# Patient Record
Sex: Male | Born: 1950
Health system: Southern US, Community
[De-identification: ages and names within clinical notes are randomized; demographics above are authoritative.]

## PROBLEM LIST (undated history)

## (undated) DIAGNOSIS — J302 Other seasonal allergic rhinitis: Secondary | ICD-10-CM

## (undated) DIAGNOSIS — E669 Obesity, unspecified: Secondary | ICD-10-CM

## (undated) DIAGNOSIS — I82409 Acute embolism and thrombosis of unspecified deep veins of unspecified lower extremity: Secondary | ICD-10-CM

## (undated) DIAGNOSIS — J189 Pneumonia, unspecified organism: Secondary | ICD-10-CM

## (undated) HISTORY — DX: Acute embolism and thrombosis of unspecified deep veins of unspecified lower extremity: I82.409

---
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
## 1972-05-19 HISTORY — PX: ANKLE SURGERY: SHX546

## 2010-11-29 ENCOUNTER — Ambulatory Visit: Payer: Self-pay | Admitting: Gastroenterology

## 2010-12-02 LAB — PATHOLOGY REPORT

## 2010-12-15 ENCOUNTER — Emergency Department: Payer: Self-pay | Admitting: Emergency Medicine

## 2017-05-19 DIAGNOSIS — I82409 Acute embolism and thrombosis of unspecified deep veins of unspecified lower extremity: Secondary | ICD-10-CM

## 2017-05-19 HISTORY — DX: Acute embolism and thrombosis of unspecified deep veins of unspecified lower extremity: I82.409

## 2018-05-03 ENCOUNTER — Inpatient Hospital Stay
Admission: EM | Admit: 2018-05-03 | Discharge: 2018-05-06 | DRG: 272 | Disposition: A | Discharge status: Home / Self Care | Payer: Medicare HMO | Attending: Specialist | Admitting: Specialist

## 2018-05-03 ENCOUNTER — Encounter: Payer: Self-pay | Admitting: Intensive Care

## 2018-05-03 ENCOUNTER — Other Ambulatory Visit: Payer: Self-pay

## 2018-05-03 ENCOUNTER — Emergency Department: Payer: Medicare HMO

## 2018-05-03 DIAGNOSIS — Z8249 Family history of ischemic heart disease and other diseases of the circulatory system: Secondary | ICD-10-CM | POA: Diagnosis not present

## 2018-05-03 DIAGNOSIS — J309 Allergic rhinitis, unspecified: Secondary | ICD-10-CM | POA: Diagnosis present

## 2018-05-03 DIAGNOSIS — Z7951 Long term (current) use of inhaled steroids: Secondary | ICD-10-CM

## 2018-05-03 DIAGNOSIS — I82412 Acute embolism and thrombosis of left femoral vein: Secondary | ICD-10-CM | POA: Diagnosis not present

## 2018-05-03 DIAGNOSIS — I824Y2 Acute embolism and thrombosis of unspecified deep veins of left proximal lower extremity: Secondary | ICD-10-CM

## 2018-05-03 DIAGNOSIS — Z6838 Body mass index (BMI) 38.0-38.9, adult: Secondary | ICD-10-CM

## 2018-05-03 DIAGNOSIS — E669 Obesity, unspecified: Secondary | ICD-10-CM | POA: Diagnosis present

## 2018-05-03 DIAGNOSIS — I82432 Acute embolism and thrombosis of left popliteal vein: Secondary | ICD-10-CM | POA: Diagnosis present

## 2018-05-03 DIAGNOSIS — I82422 Acute embolism and thrombosis of left iliac vein: Secondary | ICD-10-CM | POA: Diagnosis present

## 2018-05-03 DIAGNOSIS — Z79899 Other long term (current) drug therapy: Secondary | ICD-10-CM | POA: Diagnosis not present

## 2018-05-03 DIAGNOSIS — I82409 Acute embolism and thrombosis of unspecified deep veins of unspecified lower extremity: Secondary | ICD-10-CM | POA: Diagnosis present

## 2018-05-03 HISTORY — DX: Pneumonia, unspecified organism: J18.9

## 2018-05-03 HISTORY — DX: Obesity, unspecified: E66.9

## 2018-05-03 LAB — CBC
HEMATOCRIT: 42.3 % (ref 39.0–52.0)
Hemoglobin: 14.1 g/dL (ref 13.0–17.0)
MCH: 31.5 pg (ref 26.0–34.0)
MCHC: 33.3 g/dL (ref 30.0–36.0)
MCV: 94.6 fL (ref 80.0–100.0)
Platelets: 200 10*3/uL (ref 150–400)
RBC: 4.47 MIL/uL (ref 4.22–5.81)
RDW: 14.1 % (ref 11.5–15.5)
WBC: 9.9 10*3/uL (ref 4.0–10.5)
nRBC: 0 % (ref 0.0–0.2)

## 2018-05-03 LAB — COMPREHENSIVE METABOLIC PANEL
ALT: 102 U/L — ABNORMAL HIGH (ref 0–44)
AST: 47 U/L — ABNORMAL HIGH (ref 15–41)
Albumin: 3.2 g/dL — ABNORMAL LOW (ref 3.5–5.0)
Alkaline Phosphatase: 81 U/L (ref 38–126)
Anion gap: 8 (ref 5–15)
BILIRUBIN TOTAL: 1 mg/dL (ref 0.3–1.2)
BUN: 10 mg/dL (ref 8–23)
CO2: 24 mmol/L (ref 22–32)
Calcium: 8.7 mg/dL — ABNORMAL LOW (ref 8.9–10.3)
Chloride: 101 mmol/L (ref 98–111)
Creatinine, Ser: 0.74 mg/dL (ref 0.61–1.24)
GFR calc non Af Amer: >60 mL/min (ref >60)
Glucose, Bld: 95 mg/dL (ref 70–99)
Potassium: 3.8 mmol/L (ref 3.5–5.1)
SODIUM: 133 mmol/L — AB (ref 135–145)
Total Protein: 6.9 g/dL (ref 6.5–8.1)

## 2018-05-03 LAB — PROTIME-INR
INR: 1.02
Prothrombin Time: 13.3 seconds (ref 11.4–15.2)

## 2018-05-03 LAB — APTT: aPTT: 31 seconds (ref 24–36)

## 2018-05-03 MED ORDER — VITAMIN C 500 MG PO TABS
250.0000 mg | ORAL_TABLET | Freq: Every day | ORAL | Status: DC
  Administered 2018-05-04 – 2018-05-06 (×3): 250 mg via ORAL
  Filled 2018-05-03 (×3): qty 1

## 2018-05-03 MED ORDER — ACETAMINOPHEN 650 MG RE SUPP: 650.0000 mg | Freq: Four times a day (QID) | RECTAL | Status: DC | PRN

## 2018-05-03 MED ORDER — ONDANSETRON HCL 4 MG PO TABS: 4.0000 mg | ORAL_TABLET | Freq: Four times a day (QID) | ORAL | Status: DC | PRN

## 2018-05-03 MED ORDER — HEPARIN BOLUS VIA INFUSION
7000.0000 [IU] | Freq: Once | INTRAVENOUS | Status: AC
  Administered 2018-05-03: 7000 [IU] via INTRAVENOUS
  Filled 2018-05-03: qty 7000

## 2018-05-03 MED ORDER — HEPARIN (PORCINE) 25000 UT/250ML-% IV SOLN
1900.0000 [IU]/h | INTRAVENOUS | Status: DC
  Administered 2018-05-03 – 2018-05-04 (×2): 1900 [IU]/h via INTRAVENOUS
  Filled 2018-05-03 (×3): qty 250

## 2018-05-03 MED ORDER — ONDANSETRON HCL 4 MG/2ML IJ SOLN: 4.0000 mg | Freq: Four times a day (QID) | INTRAMUSCULAR | Status: DC | PRN

## 2018-05-03 MED ORDER — ADULT MULTIVITAMIN LIQUID CH
Freq: Every day | ORAL | Status: DC
  Administered 2018-05-04 – 2018-05-05 (×2): via ORAL
  Filled 2018-05-03 (×3): qty 15

## 2018-05-03 MED ORDER — LORATADINE 10 MG PO TABS
10.0000 mg | ORAL_TABLET | Freq: Every day | ORAL | Status: DC
  Administered 2018-05-04 – 2018-05-06 (×3): 10 mg via ORAL
  Filled 2018-05-03 (×3): qty 1

## 2018-05-03 MED ORDER — FLUTICASONE PROPIONATE 50 MCG/ACT NA SUSP
2.0000 | Freq: Every day | NASAL | Status: DC | PRN
  Filled 2018-05-03: qty 16

## 2018-05-03 MED ORDER — ACETAMINOPHEN 325 MG PO TABS: 650.0000 mg | ORAL_TABLET | Freq: Four times a day (QID) | ORAL | Status: DC | PRN

## 2018-05-03 NOTE — ED Notes (Signed)
Introduced to patient, initial assessment completed, pt updated on POC. TV remote provided and call bell within reach, Family at bedside. Awaits [place] results and provider evaluation at this time.

## 2018-05-03 NOTE — ED Notes (Signed)
Pt transported to 1A at this time via stretcher with RN and cardiac monitoring.

## 2018-05-03 NOTE — ED Notes (Signed)
Pt back in room from US.

## 2018-05-03 NOTE — ED Provider Notes (Signed)
[facility] Emergency Department Provider Note  Time seen: 7:56 PM  I have reviewed the triage vital signs and the nursing notes.   HISTORY  Chief Complaint Leg Swelling (left)    HPI John Jenkins is a 67 y.o. male with no significant past medical history presents to the emergency department for left leg swelling and tightness.  According to the patient he was diagnosed with pneumonia in November, just finished his course of steroids and antibiotics approximately 1 week ago.  States starting 2 weeks ago he notes a very minimal amount of swelling in the left leg, however the past 2 to 3 days the swelling has significantly increased along with a tightness sensation to his leg.  Denies any chest pain or any trouble breathing.  States his breathing has significantly improved since finishing the antibiotics.  No history of blood clots.  Does not take any blood thinners.   Past Medical History:  Diagnosis Date  . Obese     There are no active problems to display for this patient.   History reviewed. No pertinent surgical history.  Prior to Admission medications   Not on File    No Known Allergies  History reviewed. No pertinent family history.  Social History Social History   Tobacco Use  . Smoking status: Never Smoker  . Smokeless tobacco: Never Used  Substance Use Topics  . Alcohol use: Never    Frequency: Never  . Drug use: Never    Review of Systems Constitutional: Negative for fever. Cardiovascular: Negative for chest pain. Respiratory: Negative for shortness of breath. Gastrointestinal: Negative for abdominal pain Genitourinary: Negative for urinary compaints Musculoskeletal: Positive for left leg pain and swelling. Skin: Negative for skin complaints  Neurological: Negative for headache All other ROS negative  ____________________________________________   PHYSICAL EXAM:  VITAL SIGNS: ED Triage Vitals [05/03/18 1845]  Enc Vitals  Group     BP (!) 127/96     Pulse Rate 96     Resp 16     Temp 99.3 F (37.4 C)     Temp Source Oral     SpO2 99 %     Weight (!) 324 lb (147 kg)     Height 6\' 4"  (1.93 m)     Head Circumference      Peak Flow      Pain Score 0     Pain Loc      Pain Edu?      Excl. in [place]?    Constitutional: Alert and oriented. Well appearing and in no distress. Eyes: Normal exam ENT   Head: Normocephalic and atraumatic.   Mouth/Throat: Mucous membranes are moist. Cardiovascular: Normal rate, regular rhythm. No murmur Respiratory: Normal respiratory effort without tachypnea nor retractions. Breath sounds are clear  Gastrointestinal: Soft and nontender. No distention. Musculoskeletal: 2-3+ peripheral edema in the left lower extremity only.  2+ DP pulse.  Warm leg. Neurologic:  Normal speech and language. No gross focal neurologic deficits  Skin:  Skin is warm, dry and intact.  Psychiatric: Mood and affect are normal.  ____________________________________________   RADIOLOGY  Ultrasound shows significant DVT in the left leg  ____________________________________________   INITIAL IMPRESSION / ASSESSMENT AND PLAN / ED COURSE  Pertinent labs & imaging results that were available during my care of the patient were reviewed by me and considered in my medical decision making (see chart for details).  Patient presents to the emergency department for left leg tightness and swelling  for 2 weeks much worse over the past 2 days.  Differential would include peripheral edema versus DVT.  Patient did recently have pneumonia, states he has not been nearly as active which would increase his risk for DVT.  We will obtain ultrasound to further evaluate.  Patient agreeable to plan of care.  Ultrasound shows significant amount of DVT throughout the entire left leg.  Given the large amount of clot burden we will discuss with vascular surgery for further recommendations.  I discussed the patient with  Dr. Delana Meyer of vascular surgery, will admit to the hospitalist service on heparin with planned thrombolysis tomorrow.  Patient agreeable to plan of care.  ____________________________________________   FINAL CLINICAL IMPRESSION(S) / ED DIAGNOSES  Left lower extremity DVT    Harvest Dark, MD 05/03/18 2014

## 2018-05-03 NOTE — H&P (Signed)
[facility] [place] NAME: John Jenkins    MR#:  [id number]  DATE OF BIRTH:  1951/05/14  DATE OF ADMISSION:  05/03/2018  PRIMARY CARE PHYSICIAN: Dion Body, MD   REQUESTING/REFERRING PHYSICIAN: Dr. Harvest Dark  CHIEF COMPLAINT:   Chief Complaint  Patient presents with  . Leg Swelling    left    HISTORY OF PRESENT ILLNESS:  John Jenkins  is a 67 y.o. male with a known history of obesity, recent bronchitis/pneumonia who presents to the hospital due to left lower extremity swelling and some pain.  Patient said he had a upper respiratory infection/pneumonia that he has been battling last month and has not been as active and since this past Saturday he noticed he had some left leg swelling and tightness.  It had progressively gotten worse and therefore he came to the ER for further evaluation.  Patient underwent a ultrasound of his left lower extremity showed a extensive left lower extremity DVT.  Hospitalist services were contacted for admission.  Patient denies any chest pains, shortness of breath, palpitations, hemoptysis, abdominal pain, nausea or vomiting or any other associated symptoms presently.  Patient has never had a previous DVT in the past.  PAST MEDICAL HISTORY:   Past Medical History:  Diagnosis Date  . Obese     PAST SURGICAL HISTORY:  History reviewed. No pertinent surgical history.  SOCIAL HISTORY:   Social History   Tobacco Use  . Smoking status: Never Smoker  . Smokeless tobacco: Never Used  Substance Use Topics  . Alcohol use: Never    Frequency: Never    FAMILY HISTORY:   Family History  Problem Relation Age of Onset  . Heart disease Mother   . Heart attack Father     DRUG ALLERGIES:  No Known Allergies  REVIEW OF SYSTEMS:   Review of Systems  Constitutional: Negative for fever and weight loss.  HENT: Negative for congestion, nosebleeds and tinnitus.   Eyes: Negative for blurred vision,  double vision and redness.  Respiratory: Negative for cough, hemoptysis and shortness of breath.   Cardiovascular: Positive for leg swelling (left leg). Negative for chest pain, orthopnea and PND.  Gastrointestinal: Negative for abdominal pain, diarrhea, melena, nausea and vomiting.  Genitourinary: Negative for dysuria, hematuria and urgency.  Musculoskeletal: Negative for falls and joint pain.  Neurological: Negative for dizziness, tingling, sensory change, focal weakness, seizures, weakness and headaches.  Endo/Heme/Allergies: Negative for polydipsia. Does not bruise/bleed easily.  Psychiatric/Behavioral: Negative for depression and memory loss. The patient is not nervous/anxious.     MEDICATIONS AT HOME:   Prior to Admission medications   Medication Sig Start Date End Date Taking? Authorizing Provider  Ascorbic Acid (VITAMIN C) 100 MG tablet Take 100 mg by mouth daily.   Yes [provider]  fluticasone (FLONASE) 50 MCG/ACT nasal spray Place 2 sprays into the nose daily.   Yes [provider]  Loratadine 10 MG CAPS Take 10 mg by mouth daily.   Yes [provider]  Multiple Vitamins-Minerals (MULTIVITAMIN PO) Take 1 tablet by mouth daily.   Yes [provider]      VITAL SIGNS:  Blood pressure (!) 127/96, pulse 96, temperature 99.3 F (37.4 C), temperature source Oral, resp. rate 16, height 6\' 4"  (1.93 m), weight (!) 147 kg, SpO2 99 %.  PHYSICAL EXAMINATION:  Physical Exam  GENERAL:  67 y.o.-year-old obese patient lying in bed in no acute distress.  EYES: Pupils  equal, round, reactive to light and accommodation. No scleral icterus. Extraocular muscles intact.  HEENT: Head atraumatic, normocephalic. Oropharynx and nasopharynx clear. No oropharyngeal erythema, moist oral mucosa  NECK:  Supple, no jugular venous distention. No thyroid enlargement, no tenderness.  LUNGS: Good A/E b/l, no wheezing, rales, rhonchi. No use of accessory muscles of  respiration.  CARDIOVASCULAR: S1, S2 RRR. No murmurs, rubs, gallops, clicks.  ABDOMEN: Soft, nontender, nondistended. Bowel sounds present. No organomegaly or mass.  EXTREMITIES: + LLE edema > right, No cyanosis, or clubbing. + 2 pedal & radial pulses b/l.   NEUROLOGIC: Cranial nerves II through XII are intact. No focal Motor or sensory deficits appreciated b/l.  PSYCHIATRIC: The patient is alert and oriented x 3.  SKIN: No obvious rash, lesion, or ulcer.   LABORATORY PANEL:   CBC Recent Labs  Lab 05/03/18 2005  WBC 9.9  HGB 14.1  HCT 42.3  PLT 200   ------------------------------------------------------------------------------------------------------------------  Chemistries  Recent Labs  Lab 05/03/18 2005  NA 133*  K 3.8  CL 101  CO2 24  GLUCOSE 95  BUN 10  CREATININE 0.74  CALCIUM 8.7*  AST 47*  ALT 102*  ALKPHOS 81  BILITOT 1.0   ------------------------------------------------------------------------------------------------------------------  Cardiac Enzymes No results for input(s): TROPONINI in the last 168 hours. ------------------------------------------------------------------------------------------------------------------  RADIOLOGY:  US Venous Img Lower Unilateral Left  Result Date: 05/03/2018 CLINICAL DATA:  Left calf pain and swelling for several days EXAM: LEFT LOWER EXTREMITY VENOUS DOPPLER ULTRASOUND TECHNIQUE: Gray-scale sonography with graded compression, as well as color Doppler and duplex ultrasound were performed to evaluate the lower extremity deep venous systems from the level of the common femoral vein and including the common femoral, femoral, profunda femoral, popliteal and calf veins including the posterior tibial, peroneal and gastrocnemius veins when visible. The superficial great saphenous vein was also interrogated. Spectral Doppler was utilized to evaluate flow at rest and with distal augmentation maneuvers in the common femoral,  femoral and popliteal veins. COMPARISON:  None. FINDINGS: Contralateral Common Femoral Vein: Respiratory phasicity is normal and symmetric with the symptomatic side. No evidence of thrombus. Normal compressibility. Common Femoral Vein: Extensive thrombus is noted with decreased compressibility and decreased flow. Saphenofemoral Junction: Thrombus is noted within the saphenous vein at the saphenofemoral junction. Decreased compressibility in flow is noted. Profunda Femoral Vein: Thrombus is noted with decreased compressibility. Femoral Vein: Thrombus is noted with decreased compressibility and flow. Popliteal Vein: Thrombus is noted with decreased compressibility and flow. Calf Veins: Thrombus is noted with decreased compressibility and flow. Superficial Great Saphenous Vein: Thrombus is noted. Venous Reflux:  None. Other Findings:  None. IMPRESSION: Diffuse deep venous thrombosis within the left leg. Electronically Signed   By: Inez Catalina M.D.   On: 05/03/2018 19:55     IMPRESSION AND PLAN:   67 year old obese male with no significant past medical history who presents to the hospital due to left lower extremity swelling and redness.  1.  Left lower extremity DVT-this is the cause of patient's worsening left lower extremity redness and swelling. -This is likely a provoked DVT given patient's poor activity since his recent bout of pneumonia and bronchitis. -We will treat the patient with IV heparin drip for now.  ER physician discussed with vascular surgery who plans on doing thrombolysis tomorrow.  We will make him n.p.o. after midnight.  Patient will still need long-term anticoagulation post thrombolysis.   2. Hx of allergic Rhinitis - no acute symptoms.  - cont. Flonase, Claritin.  All the records are reviewed and case discussed with ED provider. Management plans discussed with the patient, family and they are in agreement.  CODE STATUS: Full code  TOTAL TIME TAKING CARE OF THIS PATIENT:  40 minutes.    Henreitta Leber M.D on 05/03/2018 at 9:18 PM  Between 7am to 6pm - Pager - [phone]  After 6pm go to [email or web address] - password [email or web address] [place] [facility]  Office  [phone]  CC: Primary care physician; Dion Body, MD

## 2018-05-03 NOTE — ED Notes (Signed)
Provider at bedside at this time

## 2018-05-03 NOTE — ED Notes (Signed)
PT provided with water.

## 2018-05-03 NOTE — ED Triage Notes (Signed)
Patient reports left leg swelling started Saturday morning. Tightness present to Left calf. Patient reports he has been less mobile due to being diagnosed with pneumonia March 29, 2018. [facility] sent patient here for possible blood clot in leg. Denies chest pain

## 2018-05-03 NOTE — Consult Note (Signed)
ANTICOAGULATION CONSULT NOTE - Follow Up Consult  Pharmacy Consult for Heparin infusion Indication: DVT  No Known Allergies  Patient Measurements: Height: 6\' 4"  (193 cm) Weight: (!) 324 lb (147 kg) IBW/kg (Calculated) : 86.8 Heparin Dosing Weight: 120  Vital Signs: Temp: 99.3 F (37.4 C) (12/16 1845) Temp Source: Oral (12/16 1845) BP: 127/96 (12/16 1845) Pulse Rate: 96 (12/16 1845)  Labs: No results for input(s): HGB, HCT, PLT, APTT, LABPROT, INR, HEPARINUNFRC, HEPRLOWMOCWT, CREATININE, CKTOTAL, CKMB, TROPONINI in the last 72 hours.  CrCl cannot be calculated (No successful lab value found.).   Medications:  (Not in a hospital admission)   Assessment: 67 y.o. male with no significant past medical history presents to the emergency department for left leg swelling and tightness.  Per chart review no history of anticoagulation therapy.  Goal of Therapy:  Heparin level 0.3-0.7 units/ml Monitor platelets by anticoagulation protocol: Yes   Plan:  Give 7000 units bolus x 1 Start heparin infusion at 1900 units/hr Check anti-Xa level in 6 hours until two consecutive therapeutic levels then daily and CBC daily while on heparin Continue to monitor H&H and platelets  Forrest Moron, PharmD Clinical Pharmacist 05/03/2018,8:18 PM

## 2018-05-04 ENCOUNTER — Other Ambulatory Visit (INDEPENDENT_AMBULATORY_CARE_PROVIDER_SITE_OTHER): Payer: Self-pay | Admitting: Vascular Surgery

## 2018-05-04 ENCOUNTER — Encounter: Payer: Self-pay | Admitting: *Deleted

## 2018-05-04 ENCOUNTER — Encounter
Admission: EM | Disposition: A | Discharge status: Home / Self Care | Payer: Self-pay | Source: Home / Self Care | Attending: Internal Medicine

## 2018-05-04 DIAGNOSIS — I82432 Acute embolism and thrombosis of left popliteal vein: Secondary | ICD-10-CM

## 2018-05-04 DIAGNOSIS — I82422 Acute embolism and thrombosis of left iliac vein: Secondary | ICD-10-CM

## 2018-05-04 DIAGNOSIS — I82412 Acute embolism and thrombosis of left femoral vein: Secondary | ICD-10-CM

## 2018-05-04 HISTORY — PX: PERIPHERAL VASCULAR THROMBECTOMY: CATH118306

## 2018-05-04 LAB — CBC
HCT: 40.9 % (ref 39.0–52.0)
Hemoglobin: 13.5 g/dL (ref 13.0–17.0)
MCH: 31.5 pg (ref 26.0–34.0)
MCHC: 33 g/dL (ref 30.0–36.0)
MCV: 95.6 fL (ref 80.0–100.0)
Platelets: 193 10*3/uL (ref 150–400)
RBC: 4.28 MIL/uL (ref 4.22–5.81)
RDW: 14.2 % (ref 11.5–15.5)
WBC: 7.8 10*3/uL (ref 4.0–10.5)
nRBC: 0 % (ref 0.0–0.2)

## 2018-05-04 LAB — HEPARIN LEVEL (UNFRACTIONATED)
Heparin Unfractionated: 0.4 IU/mL (ref 0.30–0.70)
Heparin Unfractionated: <0.1 IU/mL — ABNORMAL LOW (ref 0.30–0.70)

## 2018-05-04 SURGERY — PERIPHERAL VASCULAR THROMBECTOMY
Anesthesia: Moderate Sedation | Laterality: Left

## 2018-05-04 MED ORDER — MIDAZOLAM HCL 2 MG/2ML IJ SOLN
INTRAMUSCULAR | Status: DC | PRN
  Administered 2018-05-04: 1 mg via INTRAVENOUS
  Administered 2018-05-04: 2 mg via INTRAVENOUS
  Administered 2018-05-04 (×4): 1 mg via INTRAVENOUS

## 2018-05-04 MED ORDER — IOPAMIDOL (ISOVUE-300) INJECTION 61%
INTRAVENOUS | Status: DC | PRN
  Administered 2018-05-04: 85 mL via INTRAVENOUS

## 2018-05-04 MED ORDER — ALTEPLASE 2 MG IJ SOLR
INTRAMUSCULAR | Status: DC | PRN
  Administered 2018-05-04: 14 mg

## 2018-05-04 MED ORDER — HYDRALAZINE HCL 20 MG/ML IJ SOLN: 5.0000 mg | INTRAMUSCULAR | Status: DC | PRN

## 2018-05-04 MED ORDER — MORPHINE SULFATE (PF) 4 MG/ML IV SOLN: 2.0000 mg | INTRAVENOUS | Status: DC | PRN

## 2018-05-04 MED ORDER — HEPARIN SODIUM (PORCINE) 1000 UNIT/ML IJ SOLN
INTRAMUSCULAR | Status: AC
  Filled 2018-05-04: qty 1

## 2018-05-04 MED ORDER — FENTANYL CITRATE (PF) 100 MCG/2ML IJ SOLN
INTRAMUSCULAR | Status: AC
  Filled 2018-05-04: qty 2

## 2018-05-04 MED ORDER — HEPARIN SODIUM (PORCINE) 1000 UNIT/ML IJ SOLN
INTRAMUSCULAR | Status: DC | PRN
  Administered 2018-05-04 (×2): 3000 [IU] via INTRAVENOUS

## 2018-05-04 MED ORDER — ACETAMINOPHEN 325 MG PO TABS
ORAL_TABLET | ORAL | Status: AC
  Filled 2018-05-04: qty 2

## 2018-05-04 MED ORDER — ONDANSETRON HCL 4 MG/2ML IJ SOLN: 4.0000 mg | Freq: Four times a day (QID) | INTRAMUSCULAR | Status: DC | PRN

## 2018-05-04 MED ORDER — OXYCODONE HCL 5 MG PO TABS
5.0000 mg | ORAL_TABLET | ORAL | Status: DC | PRN
  Administered 2018-05-04 (×2): 5 mg via ORAL
  Filled 2018-05-04 (×2): qty 1

## 2018-05-04 MED ORDER — MIDAZOLAM HCL 5 MG/5ML IJ SOLN
INTRAMUSCULAR | Status: AC
  Filled 2018-05-04: qty 5

## 2018-05-04 MED ORDER — FENTANYL CITRATE (PF) 100 MCG/2ML IJ SOLN
INTRAMUSCULAR | Status: DC | PRN
  Administered 2018-05-04 (×6): 50 ug via INTRAVENOUS

## 2018-05-04 MED ORDER — ACETAMINOPHEN 325 MG PO TABS
650.0000 mg | ORAL_TABLET | ORAL | Status: DC | PRN
  Administered 2018-05-04: 650 mg via ORAL

## 2018-05-04 MED ORDER — CEFAZOLIN SODIUM-DEXTROSE 2-4 GM/100ML-% IV SOLN
INTRAVENOUS | Status: AC
  Administered 2018-05-04: 2 g via INTRAVENOUS
  Filled 2018-05-04: qty 100

## 2018-05-04 MED ORDER — CEFAZOLIN SODIUM-DEXTROSE 2-4 GM/100ML-% IV SOLN
2.0000 g | Freq: Once | INTRAVENOUS | Status: AC
  Administered 2018-05-04: 2 g via INTRAVENOUS
  Filled 2018-05-04: qty 100

## 2018-05-04 MED ORDER — LABETALOL HCL 5 MG/ML IV SOLN: 10.0000 mg | INTRAVENOUS | Status: DC | PRN

## 2018-05-04 MED ORDER — SODIUM CHLORIDE 0.9% FLUSH: 3.0000 mL | INTRAVENOUS | Status: DC | PRN

## 2018-05-04 MED ORDER — HEPARIN (PORCINE) 25000 UT/250ML-% IV SOLN
2100.0000 [IU]/h | INTRAVENOUS | Status: DC
  Administered 2018-05-05: 1900 [IU]/h via INTRAVENOUS
  Filled 2018-05-04: qty 250

## 2018-05-04 MED ORDER — SODIUM CHLORIDE 0.9 % IV SOLN
250.0000 mL | INTRAVENOUS | Status: DC | PRN
  Administered 2018-05-04 – 2018-05-05 (×2): 250 mL via INTRAVENOUS

## 2018-05-04 MED ORDER — METHYLPREDNISOLONE SODIUM SUCC 125 MG IJ SOLR: 125.0000 mg | INTRAMUSCULAR | Status: DC | PRN

## 2018-05-04 MED ORDER — SODIUM CHLORIDE 0.9% FLUSH
3.0000 mL | Freq: Two times a day (BID) | INTRAVENOUS | Status: DC
  Administered 2018-05-05: 3 mL via INTRAVENOUS

## 2018-05-04 MED ORDER — SODIUM CHLORIDE 0.9 % IV SOLN: INTRAVENOUS | Status: AC

## 2018-05-04 MED ORDER — HYDROMORPHONE HCL 1 MG/ML IJ SOLN: 1.0000 mg | Freq: Once | INTRAMUSCULAR | Status: DC | PRN

## 2018-05-04 MED ORDER — FAMOTIDINE 20 MG PO TABS: 40.0000 mg | ORAL_TABLET | ORAL | Status: DC | PRN

## 2018-05-04 MED ORDER — SODIUM CHLORIDE 0.9 % IV SOLN
INTRAVENOUS | Status: DC
  Administered 2018-05-04: 09:00:00 via INTRAVENOUS

## 2018-05-04 MED ORDER — ALTEPLASE 2 MG IJ SOLR
INTRAMUSCULAR | Status: AC
  Filled 2018-05-04: qty 14

## 2018-05-04 MED ORDER — LIDOCAINE HCL (PF) 1 % IJ SOLN
INTRAMUSCULAR | Status: AC
  Filled 2018-05-04: qty 30

## 2018-05-04 SURGICAL SUPPLY — 30 items
BALLN ATG 12X6X80 (BALLOONS) ×3
BALLN ATG 14X4X80 (BALLOONS) ×3
BALLN ATG 18X6X80 (BALLOONS) ×3
BALLN DORADO 8X100X80 (BALLOONS) ×3
BALLOON ATG 12X6X80 (BALLOONS) ×1 IMPLANT
BALLOON ATG 14X4X80 (BALLOONS) ×1 IMPLANT
BALLOON ATG 18X6X80 (BALLOONS) ×1 IMPLANT
BALLOON DORADO 8X100X80 (BALLOONS) ×1 IMPLANT
CANISTER PENUMBRA ENGINE (MISCELLANEOUS) ×3 IMPLANT
CATH BEACON 5 .035 65 KMP TIP (CATHETERS) ×3 IMPLANT
CATH BEACON 5 .038 100 VERT TP (CATHETERS) ×3 IMPLANT
CATH INDIGO 8XTORQ 115 KIT (CATHETERS) ×3 IMPLANT
CATH INDIGO SEP 8 (CATHETERS) ×3 IMPLANT
CATH INFUS 135CMX50CM (CATHETERS) ×3 IMPLANT
CATH VISIONS PV .035 IVUS (CATHETERS) ×3 IMPLANT
DEVICE PRESTO INFLATION (MISCELLANEOUS) ×3 IMPLANT
DEVICE TORQUE .025-.038 (MISCELLANEOUS) ×3 IMPLANT
NEEDLE ENTRY 21GA 7CM ECHOTIP (NEEDLE) ×3 IMPLANT
PACK ANGIOGRAPHY (CUSTOM PROCEDURE TRAY) ×3 IMPLANT
SET INTRO CAPELLA COAXIAL (SET/KITS/TRAYS/PACK) ×3 IMPLANT
SHEATH BRITE TIP 10FRX11 (SHEATH) ×3 IMPLANT
SHEATH BRITE TIP 8FRX11 (SHEATH) ×3 IMPLANT
SHIELD X-DRAPE GOLD 12X17 (MISCELLANEOUS) ×3 IMPLANT
STENT VENOVO 20X80X80 (Permanent Stent) ×3 IMPLANT
SYR MEDRAD MARK V 150ML (SYRINGE) ×3 IMPLANT
TUBING CONTRAST HIGH PRESS 72 (TUBING) ×3 IMPLANT
WIRE AQUATRACK .035X260CM (WIRE) ×6 IMPLANT
WIRE J 3MM .035X145CM (WIRE) ×3 IMPLANT
WIRE MAGIC TOR.035 180C (WIRE) ×3 IMPLANT
WIRE MAGIC TORQUE 260C (WIRE) ×3 IMPLANT

## 2018-05-04 NOTE — Care Management (Signed)
Eliquis voucher left at bedside room 154. Patient was off unit when CM rounded.

## 2018-05-04 NOTE — Progress Notes (Signed)
Per Vascular team, prep behind left knee only at this time. MD to update if needed after speaking with Patient at bedside.

## 2018-05-04 NOTE — Progress Notes (Signed)
Patient ID: John Jenkins, male   DOB: 1950-10-06, 67 y.o.   MRN: [id number]  [employer] PROGRESS NOTE  ONEAL SCHOENBERGER CZY:[id number] DOB: 1950/06/03 DOA: 05/03/2018 PCP: Dion Body, MD  HPI/Subjective: Patient feels well.  Offers no complaints.  Came back from thrombectomy today.  Objective: Vitals:   05/04/18 1400 05/04/18 1420  BP: 136/82 127/74  Pulse: 76 79  Resp: 16   Temp:  98.1 F (36.7 C)  SpO2: 92% 96%    Filed Weights   05/03/18 1845 05/03/18 2224 05/04/18 0834  Weight: (!) 147 kg (!) 145 kg (!) 145.2 kg    ROS: Review of Systems  Constitutional: Negative for chills and fever.  Eyes: Negative for blurred vision.  Respiratory: Negative for cough and shortness of breath.   Cardiovascular: Negative for chest pain.  Gastrointestinal: Negative for abdominal pain, constipation, diarrhea, nausea and vomiting.  Genitourinary: Negative for dysuria.  Musculoskeletal: Negative for joint pain.  Neurological: Negative for dizziness and headaches.   Exam: Physical Exam  HENT:  Nose: No mucosal edema.  Mouth/Throat: No oropharyngeal exudate or posterior oropharyngeal edema.  Eyes: Pupils are equal, round, and reactive to light. Conjunctivae, EOM and lids are normal.  Neck: No JVD present. Carotid bruit is not present. No edema present. No thyroid mass and no thyromegaly present.  Cardiovascular: S1 normal and S2 normal. Exam reveals no gallop.  No murmur heard. Pulses:      Dorsalis pedis pulses are 2+ on the right side and 2+ on the left side.  Respiratory: No respiratory distress. He has no wheezes. He has no rhonchi. He has no rales.  GI: Soft. Bowel sounds are normal. There is no abdominal tenderness.  Musculoskeletal:     Right ankle: He exhibits swelling.     Left ankle: He exhibits swelling.  Lymphadenopathy:    He has no cervical adenopathy.  Neurological: He is alert. No cranial nerve deficit.  Skin: Skin is warm. No rash noted. Nails show no  clubbing.  Left leg wrapped in compression dressing  Psychiatric: He has a normal mood and affect.      Data Reviewed: Basic Metabolic Panel: Recent Labs  Lab 05/03/18 2005  NA 133*  K 3.8  CL 101  CO2 24  GLUCOSE 95  BUN 10  CREATININE 0.74  CALCIUM 8.7*   Liver Function Tests: Recent Labs  Lab 05/03/18 2005  AST 47*  ALT 102*  ALKPHOS 81  BILITOT 1.0  PROT 6.9  ALBUMIN 3.2*   CBC: Recent Labs  Lab 05/03/18 2005 05/04/18 0352  WBC 9.9 7.8  HGB 14.1 13.5  HCT 42.3 40.9  MCV 94.6 95.6  PLT 200 193     Studies: US Venous Img Lower Unilateral Left  Result Date: 05/03/2018 CLINICAL DATA:  Left calf pain and swelling for several days EXAM: LEFT LOWER EXTREMITY VENOUS DOPPLER ULTRASOUND TECHNIQUE: Gray-scale sonography with graded compression, as well as color Doppler and duplex ultrasound were performed to evaluate the lower extremity deep venous systems from the level of the common femoral vein and including the common femoral, femoral, profunda femoral, popliteal and calf veins including the posterior tibial, peroneal and gastrocnemius veins when visible. The superficial great saphenous vein was also interrogated. Spectral Doppler was utilized to evaluate flow at rest and with distal augmentation maneuvers in the common femoral, femoral and popliteal veins. COMPARISON:  None. FINDINGS: Contralateral Common Femoral Vein: Respiratory phasicity is normal and symmetric with the symptomatic side. No evidence of thrombus. Normal  compressibility. Common Femoral Vein: Extensive thrombus is noted with decreased compressibility and decreased flow. Saphenofemoral Junction: Thrombus is noted within the saphenous vein at the saphenofemoral junction. Decreased compressibility in flow is noted. Profunda Femoral Vein: Thrombus is noted with decreased compressibility. Femoral Vein: Thrombus is noted with decreased compressibility and flow. Popliteal Vein: Thrombus is noted with decreased  compressibility and flow. Calf Veins: Thrombus is noted with decreased compressibility and flow. Superficial Great Saphenous Vein: Thrombus is noted. Venous Reflux:  None. Other Findings:  None. IMPRESSION: Diffuse deep venous thrombosis within the left leg. Electronically Signed   By: Inez Catalina M.D.   On: 05/03/2018 19:55    Scheduled Meds: . acetaminophen      . loratadine  10 mg Oral Daily  . multivitamin   Oral Daily  . sodium chloride flush  3 mL Intravenous Q12H  . vitamin C  250 mg Oral Daily   Continuous Infusions: . sodium chloride    . sodium chloride    . heparin 1,900 Units/hr (05/04/18 1337)    Assessment/Plan:  1. Extensive left lower extremity DVT status post thrombolysis and thrombectomy by Dr. Delana Meyer today.  Continue heparin drip today.  Likely switch over to Eliquis tomorrow.  Benefits and risks of blood thinner explained to the patient. 2. Obesity with a BMI of 38.95.  Weight loss needed. 3. Elevated liver function test.  Check again tomorrow morning.  Code Status:     Code Status Orders  (From admission, onward)         Start     Ordered   05/03/18 2203  Full code  Continuous     05/03/18 2203        Code Status History    This patient has a current code status but no historical code status.    Advance Directive Documentation     Most Recent Value  Type of Advance Directive  Living will  Pre-existing out of facility DNR order (yellow form or pink MOST form)  -  "MOST" Form in Place?  -     Family Communication: Son at the bedside Disposition Plan: To be determined  Consultants:  Vascular surgery  Procedures:  Left leg thrombolysis and thrombectomy  Time spent: 28 minutes  [place]

## 2018-05-04 NOTE — Care Management Note (Signed)
Case Management Note  Patient Details  Name: PEDROHENRIQUE MCCONVILLE MRN: [id number] Date of Birth: Sep 23, 1950  Subjective/Objective:                  RNCM met with patient to explain process of Eliquis voucher use. He uses [facility] or [place] for medications. He states he has a walker available for use at home if needed. He is independent with mobility at baseline.     Action/Plan: RNCM will follow.   Expected Discharge Date:                  Expected Discharge Plan:     In-House Referral:     Discharge planning Services  Medication Assistance  Post Acute Care Choice:    Choice offered to:  Patient  DME Arranged:    DME Agency:     HH Arranged:    [place] Agency:     Status of Service:  In process, will continue to follow  If discussed at Long Length of Stay Meetings, dates discussed:    Additional Comments:  Marshell Garfinkel, RN 05/04/2018, 3:50 PM

## 2018-05-04 NOTE — Progress Notes (Signed)
ANTICOAGULATION CONSULT NOTE - Follow Up Consult  Pharmacy Consult for Heparin  Indication: DVT  No Known Allergies  Patient Measurements: Height: 6\' 4"  (193 cm) Weight: (!) 320 lb (145.2 kg) IBW/kg (Calculated) : 86.8 Heparin Dosing Weight: 120 kg  Vital Signs: Temp: 98.1 F (36.7 C) (12/17 1420) Temp Source: Oral (12/17 1420) BP: 127/74 (12/17 1420) Pulse Rate: 79 (12/17 1420)  Labs: Recent Labs    05/03/18 2005 05/04/18 0352  HGB 14.1 13.5  HCT 42.3 40.9  PLT 200 193  APTT 31  --   LABPROT 13.3  --   INR 1.02  --   HEPARINUNFRC  --  0.40  CREATININE 0.74  --     Estimated Creatinine Clearance: 143.5 mL/min (by C-G formula based on SCr of 0.74 mg/dL).   Medications:  Medications Prior to Admission  Medication Sig Dispense Refill Last Dose  . Ascorbic Acid (VITAMIN C) 100 MG tablet Take 100 mg by mouth daily.   05/03/2018 at 0800  . fluticasone (FLONASE) 50 MCG/ACT nasal spray Place 2 sprays into the nose daily.   prn at prn  . Loratadine 10 MG CAPS Take 10 mg by mouth daily.   05/03/2018 at 0800  . Multiple Vitamins-Minerals (MULTIVITAMIN PO) Take 1 tablet by mouth daily.   05/03/2018 at 0800    Assessment: Pharmacy consulted to dose heparin for DVT in this 67 year old male,  S/P thrombectomy on 12/17 @ 10:00.  CrCl = 120 ml/min Per Dr Delana Meyer, resume heparin gtt at previous rate with no bolus.  Ok to follow nomogram thereafter with boluses.   Goal of Therapy:  Heparin level 0.3-0.7 units/ml Monitor platelets by anticoagulation protocol: Yes   Plan:  12/17 @ 0400 HL 0.40 therapeutic. Will continue current rate and will recheck HL @ 1200, CBC stable will continue to monitor.  12/17  @ 14:30 :    Heparin was d/c'd for thrombectomy but will resume at previous rate of 1900 units/hr per Dr Delana Meyer.  Will recheck HL 6 hrs after restart on 12/17 @ 20:30.   Ichiro Chesnut D 05/04/2018,2:41 PM

## 2018-05-04 NOTE — Progress Notes (Signed)
ANTICOAGULATION CONSULT NOTE - Follow Up Consult  Pharmacy Consult for Heparin  Indication: DVT  No Known Allergies  Patient Measurements: Height: 6\' 4"  (193 cm) Weight: (!) 320 lb (145.2 kg) IBW/kg (Calculated) : 86.8 Heparin Dosing Weight: 120 kg  Vital Signs: Temp: 98.6 F (37 C) (12/17 1813) Temp Source: Oral (12/17 1813) BP: 124/67 (12/17 2211) Pulse Rate: 80 (12/17 1957)  Labs: Recent Labs    05/03/18 2005 05/04/18 0352 05/04/18 2023  HGB 14.1 13.5  --   HCT 42.3 40.9  --   PLT 200 193  --   APTT 31  --   --   LABPROT 13.3  --   --   INR 1.02  --   --   HEPARINUNFRC  --  0.40 <0.10*  CREATININE 0.74  --   --     Estimated Creatinine Clearance: 143.5 mL/min (by C-G formula based on SCr of 0.74 mg/dL).   Medications:  Medications Prior to Admission  Medication Sig Dispense Refill Last Dose  . Ascorbic Acid (VITAMIN C) 100 MG tablet Take 100 mg by mouth daily.   05/03/2018 at 0800  . fluticasone (FLONASE) 50 MCG/ACT nasal spray Place 2 sprays into the nose daily.   prn at prn  . Loratadine 10 MG CAPS Take 10 mg by mouth daily.   05/03/2018 at 0800  . Multiple Vitamins-Minerals (MULTIVITAMIN PO) Take 1 tablet by mouth daily.   05/03/2018 at 0800    Assessment: Pharmacy consulted to dose heparin for DVT in this 67 year old male,  S/P thrombectomy on 12/17 @ 10:00.  CrCl = 120 ml/min Per Dr Delana Meyer, resume heparin gtt at previous rate with no bolus.  Ok to follow nomogram thereafter with boluses.   Goal of Therapy:  Heparin level 0.3-0.7 units/ml Monitor platelets by anticoagulation protocol: Yes   Plan:  12/17 @ 2030 HL < 0.10 subtherapeutic; however, heparin was not restarted until 2130, therefore this HL is inaccurate. Will continue rate of 1900 units/hr and will check a 6 hour level w/ am labs.  Tobie Lords, PharmD, BCPS Clinical Pharmacist 05/04/2018

## 2018-05-04 NOTE — Progress Notes (Signed)
This RN assumed care after pt returned from special procedures. Coban dressing to most of left leg had been CDI, no issues with left posterior knee site.   During shift change, site was assessed with oncoming RN and a large amount of bright red blood had saturated sheet and the underside of pt's coban dressing. Pressure immediately applied, leg had already been elevated. Dr. Delana Meyer notified, received orders to stop heparin drip for two hours (was stopped at 7:10pm), apply pressure for 15 minutes, resume bedrest for 4 more hours, keep leg elevated, and to reinforce dressing with gauze and a lightly wrapped ace bandage. BP 122/66 at that time. New orders initiated with night RN. Pt and family aware of plan.

## 2018-05-04 NOTE — Progress Notes (Signed)
   05/04/18 0945  Clinical Encounter Type  Visited With Patient not available  Visit Type Initial (order for AD)  Referral From Nurse   Chaplain attempted to follow up on order for AD; patient and bed not in room.  Will attempt follow up later today.

## 2018-05-04 NOTE — Progress Notes (Signed)
   05/04/18 1015  Clinical Encounter Type  Visited With Patient and family together  Visit Type Initial;Spiritual support;Pre-op  Referral From Nurse  Consult/Referral To Chaplain  Spiritual Encounters  Spiritual Needs Prayer;Other (Comment)   CH received an OR to help the patient complete an AD. The patient is in Pre-op and decided that he would wait until later on today to complete his AD. I prayed with the patient and will follow up post-op.

## 2018-05-04 NOTE — Op Note (Signed)
[facility]                                                OPERATIVE NOTE    PRE-OPERATIVE DIAGNOSIS: Symptomatic left leg DVT  POST-OPERATIVE DIAGNOSIS: Same,   PROCEDURE: 1. Ultrasound guidance for vascular access to the left popliteal vein 2. Left lower extremity venogram 3. Inferior venacavogram 4.  Infusion thrombolysis with 14 mg of TPA 5. Introduction catheter into venous system second order catheter placement left popliteal approach 6.Mechanical thrombectomy of the left popliteal, SFV and common femoral veins and the left external iliac vein and common iliac vein using the penumbra cat 8 catheter 7.Percutaneous transluminal angioplasty and stent placement left common iliac vein 8.   Percutaneous transluminal angioplasty left external iliac vein 9.    Percutaneous transluminal angioplasty left common femoral and superficial femoral veins 10.  IVUS left common and left external iliac veins and the left common femoral vein  SURGEON: Hortencia Pilar  ASSISTANT(S): None  ANESTHESIA: Conscious sedation was administered by the interventional radiology RN under my direct supervision. IV Versed plus fentanyl were utilized. Continuous ECG, pulse oximetry and blood pressure was monitored throughout the entire procedure.  Conscious sedation was administered for a total of 138 minutes.  ESTIMATED BLOOD LOSS: minimal  Contrast:85 cc  Fluoroscopy time:17.2 minutes  FINDING(S): 1. Patent IVC; thrombus within the left popliteal, superficial femoral vein and common femoral vein and  SPECIMEN(S): none  INDICATIONS:  John Jenkins is a 67 y.o. year old male who presents with massive swelling of the left leg that is quite painful. Risks and benefits including filter thrombosis, migration, fracture, bleeding, and infection were all discussed. We discussed that all IVC filters that we place can be removed if desired from  the patient once the need for the filter has passed.   DESCRIPTION: After obtaining full informed written consent, the patient was brought back to the vascular suite.   The patient was then positioned prone and the popliteal fossa of the left leg was prepped and draped in a sterile fashion. Ultrasound was placed in a sterile sleeve. Ultrasound is utilized to gain access to the popliteal vein. Vein is known to have thrombus within it by previous ultrasound. It is therefore identified by its enlarged size with heterogenous thrombus and non-compressibility. 1% lidocaine is infiltrated in soft tissues and subsequent microneedle is inserted into the popliteal vein without difficulty. Images recorded for the permanent record and the puncture is made under real-time visualization. Microwire is then advanced under fluoroscopic guidance and noted to follow the path of the superficial femoral vein. Therefore the micro-sheath was placed followed by a Magic torque wire and subsequently an 8 [place] sheath.  KMP catheter together with the Magic torque wire then advanced up to the level of the common iliac and hand injection contrast is utilized to demonstrate that the common iliac vein on the left is patent at the confluence of the iliac veins and the inferior vena cava is also found to be patent by hand-injection. Catheter is then repositioned to the common femoral level and hand injection contrast demonstrates that there is occlusive thrombus within the common femoral, external iliac vein and distal to mid common iliac vein.  Repositioning  of the KMP catheter then demonstrated thrombus within the superficial femoral vein and popliteal vein.  50 cm length infusion catheter is then prepped on the field and 14 mg of TPA is reconstituted 50 cc. This is then laced throughout the common iliac vein, external iliac vein, common femoral vein, superficial femoral and popliteal veins. The TPA is then allowed to dwell for 30  minutes. Subsequently, the penumbra cat 6 catheter with a separator is engaged in the aspiration mode and aspiration of the popliteal vein, superficial femoral vein, common femoral vein, external iliac vein and lastly the common iliac vein is performed multiple passes are made with the volumes recorded in the procedure notes.  Follow-up imaging now demonstrates that majority of the thrombus has been eliminated from the proximal popliteal through the iliac veins.  There appear to be several strictures of greater than 70% within the superficial femoral vein as well as within the common femoral vein.  There are also greater than 80% strictures in the proximal external iliac vein and there appears to be an occlusion of the distal common iliac vein.  The IVIS catheter was then introduced through the 8 [place] sheath.  Images are now created of the left common iliac vein as well as the left external iliac vein and common femoral vein.  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  Appropriate measurements were also taken and based on these measurements and the findings consistent with may Thurner I elected to stent this area.  The sheath was upsized to a 10 [place] sheath and a 20 mm x 80 mm V. Novo stent was opened onto the field.  Magnified imaging with hand-injection of contrast from the popliteal sheath was then performed demonstrating the confluence of the common iliac veins this was marked on the screen and the vein Novo stent was deployed approximately 5 mm distal to the vena cava well within the left common iliac vein.  It extends across the stricture.  It is then postdilated with an 18 mm diameter Atlas balloon inflated to 14 atm.  I then treated the external iliac artery with a 16 mm diameter Atlas balloon inflated to 10 atm for approximately 1 minute and  subsequently a 12 mm x 60 mm Dor[place] balloon was used to treat the left common femoral vein.  Inflation was to 14 atm for approximately 1 minute.  The strictures within the superficial femoral vein were then treated with an 8 mm x 100 mm Dor[place] balloon.  Approximately 4 inflations were required extending the balloon from the proximal superficial femoral vein down to the proximal popliteal.  Hand-injection of contrast now demonstrated patency of the proximal popliteal vein, superficial femoral vein, common femoral vein, external iliac vein and common iliac vein with less than 20% residual stenosis throughout the entire venous circuit there is now flow filling the vena cava by hand-injection from the popliteal sheath.  Given that the patient now is patent with minimal residual thrombus from the distal popliteal to the IVC the procedure is terminated. The wires removed the sheath is removed pressures held and a pressure dressing with Coban and is then applied. The patient is then returned to the supine position  Interpretation: Initial images demonstrated normal vena cava. Initial images the left lower extremity then demonstrated extensive thrombus throughout the popliteal and femoral veins including the iliac veins.  Following intervention described above there is near-total resolution of thrombus throughout the venous system.  IVIS is consistent with a may Thurner and in a occlusion of the common iliac vein associated with strictures as noted above throughout the more distal veins.  Following angioplasty and stent placement and then subsequent stand-alone angioplasty throughout the venous circuit as described above there is less than 20% residual stenosis and have reestablished forward venous flow.  COMPLICATIONS: None  CONDITION: Stable  Hortencia Pilar  05/04/2018,1:25 PM

## 2018-05-04 NOTE — Consult Note (Signed)
ANTICOAGULATION CONSULT NOTE - Follow Up Consult  Pharmacy Consult for Heparin infusion Indication: DVT  No Known Allergies  Patient Measurements: Height: 6\' 4"  (193 cm) Weight: (!) 319 lb 10.7 oz (145 kg) IBW/kg (Calculated) : 86.8 Heparin Dosing Weight: 120  Vital Signs: Temp: 98.8 F (37.1 C) (12/16 2224) Temp Source: Oral (12/16 2224) BP: 137/72 (12/16 2224) Pulse Rate: 86 (12/16 2224)  Labs: Recent Labs    05/03/18 2005 05/04/18 0352  HGB 14.1 13.5  HCT 42.3 40.9  PLT 200 193  APTT 31  --   LABPROT 13.3  --   INR 1.02  --   HEPARINUNFRC  --  0.40  CREATININE 0.74  --     Estimated Creatinine Clearance: 143.4 mL/min (by C-G formula based on SCr of 0.74 mg/dL).   Medications:  Medications Prior to Admission  Medication Sig Dispense Refill Last Dose  . Ascorbic Acid (VITAMIN C) 100 MG tablet Take 100 mg by mouth daily.   05/03/2018 at 0800  . fluticasone (FLONASE) 50 MCG/ACT nasal spray Place 2 sprays into the nose daily.   prn at prn  . Loratadine 10 MG CAPS Take 10 mg by mouth daily.   05/03/2018 at 0800  . Multiple Vitamins-Minerals (MULTIVITAMIN PO) Take 1 tablet by mouth daily.   05/03/2018 at 0800    Assessment: 67 y.o. male with no significant past medical history presents to the emergency department for left leg swelling and tightness.  Per chart review no history of anticoagulation therapy.  Goal of Therapy:  Heparin level 0.3-0.7 units/ml Monitor platelets by anticoagulation protocol: Yes   Plan:  12/17 @ 0400 HL 0.40 therapeutic. Will continue current rate and will recheck HL @ 1200, CBC stable will continue to monitor.  Tobie Lords, PharmD Clinical Pharmacist 05/04/2018,6:26 AM

## 2018-05-04 NOTE — Consult Note (Signed)
[facility] Vascular Consult Note  MRN : [id number]  John Jenkins is a 67 y.o. (1950/06/21) male who presents with chief complaint of  Chief Complaint  Patient presents with  . Leg Swelling    left  .  History of Present Illness:   I am asked to evaluate the patient by Dr. Les Pou.  The patient is a 67 year old gentleman who presented yesterday evening to the [facility] regional emergency room with a very painful and very swollen left lower extremity.  The patient recently underwent treatment for pneumonia which as he relates slowed him down quite a bit for a couple weeks during which time he was fairly sedentary.  He was seen by pulmonologist out a week ago and commented on the new onset swelling of the left lower extremity.  Subsequently the swelling became significantly worse and a duplex ultrasound was obtained.  Results showed deep venous thrombosis from the popliteal to the common femoral a was asked to come to the emergency room immediately.  In the ER DVT was confirmed he was initiated on a heparin drip and he has been admitted and is undergoing elevation and anticoagulation.  Current Meds  Medication Sig  . Ascorbic Acid (VITAMIN C) 100 MG tablet Take 100 mg by mouth daily.  . fluticasone (FLONASE) 50 MCG/ACT nasal spray Place 2 sprays into the nose daily.  . Loratadine 10 MG CAPS Take 10 mg by mouth daily.  . Multiple Vitamins-Minerals (MULTIVITAMIN PO) Take 1 tablet by mouth daily.    Past Medical History:  Diagnosis Date  . Obese   . Pneumonia     History reviewed. No pertinent surgical history.  Social History Social History   Tobacco Use  . Smoking status: Never Smoker  . Smokeless tobacco: Never Used  Substance Use Topics  . Alcohol use: Never    Frequency: Never  . Drug use: Never    Family History Family History  Problem Relation Age of Onset  . Heart disease Mother   . Heart attack Father   No family history of  bleeding/clotting disorders, porphyria or autoimmune disease   No Known Allergies   REVIEW OF SYSTEMS (Negative unless checked)  Constitutional: [] Weight loss  [] Fever  [] Chills Cardiac: [] Chest pain   [] Chest pressure   [] Palpitations   [] Shortness of breath when laying flat   [] Shortness of breath at rest   [] Shortness of breath with exertion. Vascular:  [x] Pain in legs with walking   [x] Pain in legs with standing   [] Pain in legs when laying flat   [] Claudication   [] Pain in feet when walking  [] Pain in feet at rest  [] Pain in feet when laying flat   [x] History of DVT   [] Phlebitis   [x] Swelling in legs   [] Varicose veins   [] Non-healing ulcers Pulmonary:   [] Uses home oxygen   [] Productive cough   [] Hemoptysis   [] Wheeze  [] COPD   [x] Asthma Neurologic:  [] Dizziness  [] Blackouts   [] Seizures   [] History of stroke   [] History of TIA  [] Aphasia   [] Temporary blindness   [] Dysphagia   [] Weakness or numbness in arms   [] Weakness or numbness in legs Musculoskeletal:  [] Arthritis   [] Joint swelling   [] Joint pain   [] Low back pain Hematologic:  [] Easy bruising  [] Easy bleeding   [] Hypercoagulable state   [] Anemic  [] Hepatitis Gastrointestinal:  [] Blood in stool   [] Vomiting blood  [] Gastroesophageal reflux/heartburn   [] Difficulty swallowing. Genitourinary:  [] Chronic kidney disease   []   Difficult urination  [] Frequent urination  [] Burning with urination   [] Blood in urine Skin:  [] Rashes   [] Ulcers   [] Wounds Psychological:  [] History of anxiety   []  History of major depression.  Physical Examination  Vitals:   05/03/18 2153 05/03/18 2224 05/04/18 0812 05/04/18 0834  BP: 116/62 137/72 122/68 130/78  Pulse: 87 86 72 83  Resp: 16 18  14   Temp:  98.8 F (37.1 C) 98.6 F (37 C) 97.9 F (36.6 C)  TempSrc:  Oral Oral Oral  SpO2: 97% 100% 98% 100%  Weight:  (!) 145 kg  (!) 145.2 kg  Height:  6\' 4"  (1.93 m)  6\' 4"  (1.93 m)   Body mass index is 38.95 kg/m. Gen:  WD/WN, NAD Head: [place]/AT, No  temporalis wasting. Prominent temp pulse not noted. Ear/Nose/Throat: Hearing grossly intact, nares w/o erythema or drainage, oropharynx w/o Erythema/Exudate Eyes: PERRLA, EOMI.  Neck: Supple, no nuchal rigidity.  No bruit or JVD.  Pulmonary:  Good air movement, clear to auscultation bilaterally.  Cardiac: RRR, normal S1, S2, no Murmurs, rubs or gallops. Vascular: Left lower extremity is severely swollen it is approximately twice the size of the right.  Is soft and pitting.  There is moderate to severe venous stasis changes of the ankles bilaterally. Vessel Right Left  Radial Palpable Palpable  PT Palpable Palpable  DP Palpable Palpable  Gastrointestinal: soft, non-tender/non-distended. No guarding/reflex. No masses, surgical incisions, or scars. Musculoskeletal: M/S 5/5 throughout.  Extremities without ischemic changes.  No deformity or atrophy. No edema. Neurologic: CN 2-12 intact. Pain and light touch intact in extremities.  Symmetrical.  Speech is fluent. Motor exam as listed above. Psychiatric: Judgment intact, Mood & affect appropriate for pt's clinical situation. Dermatologic: Moderate to severe venous rashes left greater than right no ulcers noted.  No cellulitis or open wounds. Lymph : No Cervical, Axillary, or Inguinal lymphadenopathy.    CBC Lab Results  Component Value Date   WBC 7.8 05/04/2018   HGB 13.5 05/04/2018   HCT 40.9 05/04/2018   MCV 95.6 05/04/2018   PLT 193 05/04/2018    BMET    Component Value Date/Time   NA 133 (L) 05/03/2018 2005   K 3.8 05/03/2018 2005   CL 101 05/03/2018 2005   CO2 24 05/03/2018 2005   GLUCOSE 95 05/03/2018 2005   BUN 10 05/03/2018 2005   CREATININE 0.74 05/03/2018 2005   CALCIUM 8.7 (L) 05/03/2018 2005   GFRNONAA >60 05/03/2018 2005   GFRAA >60 05/03/2018 2005   Estimated Creatinine Clearance: 143.5 mL/min (by C-G formula based on SCr of 0.74 mg/dL).  COAG Lab Results  Component Value Date   INR 1.02 05/03/2018     Radiology US Venous Img Lower Unilateral Left  Result Date: 05/03/2018 CLINICAL DATA:  Left calf pain and swelling for several days EXAM: LEFT LOWER EXTREMITY VENOUS DOPPLER ULTRASOUND TECHNIQUE: Gray-scale sonography with graded compression, as well as color Doppler and duplex ultrasound were performed to evaluate the lower extremity deep venous systems from the level of the common femoral vein and including the common femoral, femoral, profunda femoral, popliteal and calf veins including the posterior tibial, peroneal and gastrocnemius veins when visible. The superficial great saphenous vein was also interrogated. Spectral Doppler was utilized to evaluate flow at rest and with distal augmentation maneuvers in the common femoral, femoral and popliteal veins. COMPARISON:  None. FINDINGS: Contralateral Common Femoral Vein: Respiratory phasicity is normal and symmetric with the symptomatic side. No evidence of thrombus. Normal  compressibility. Common Femoral Vein: Extensive thrombus is noted with decreased compressibility and decreased flow. Saphenofemoral Junction: Thrombus is noted within the saphenous vein at the saphenofemoral junction. Decreased compressibility in flow is noted. Profunda Femoral Vein: Thrombus is noted with decreased compressibility. Femoral Vein: Thrombus is noted with decreased compressibility and flow. Popliteal Vein: Thrombus is noted with decreased compressibility and flow. Calf Veins: Thrombus is noted with decreased compressibility and flow. Superficial Great Saphenous Vein: Thrombus is noted. Venous Reflux:  None. Other Findings:  None. IMPRESSION: Diffuse deep venous thrombosis within the left leg. Electronically Signed   By: Inez Catalina M.D.   On: 05/03/2018 19:55      Assessment/Plan 1.  Symptomatic DVT: Patient should undergo venography with thrombolysis.  Overall he is very healthy individual and given the massive clot burden as well as the already significant  venous stasis changes his long-term prognosis without intervention would be for significant debility associated with severe postphlebitic syndrome.  I have reviewed the risks and the benefits with the patient I have also discussed alternative therapies including conservative therapy with anticoagulation elevation and compression.  The patient has reviewed this with me I have answered all his questions and he wishes to proceed with thrombectomy.   A total of 60 minutes was spent with this patient as well as consulting physicians and greater than 50% was spent in counseling and coordination of care with the patient.  Discussion included the treatment options for vascular disease including indications for surgery and intervention.  Also discussed is the appropriate timing of treatment.  In addition medical therapy was discussed.  2.  Recent pneumonia associated with reactive airway disease: Continue pulmonary medications and aerosols as already ordered, these medications have been reviewed and there are no changes at this time.  He will continue follow-up with pulmonology.     Hortencia Pilar, MD  05/04/2018 10:41 AM

## 2018-05-05 ENCOUNTER — Encounter: Payer: Self-pay | Admitting: Vascular Surgery

## 2018-05-05 DIAGNOSIS — I824Y2 Acute embolism and thrombosis of unspecified deep veins of left proximal lower extremity: Secondary | ICD-10-CM

## 2018-05-05 LAB — HEPARIN LEVEL (UNFRACTIONATED)
Heparin Unfractionated: 0.13 IU/mL — ABNORMAL LOW (ref 0.30–0.70)
Heparin Unfractionated: 0.26 IU/mL — ABNORMAL LOW (ref 0.30–0.70)

## 2018-05-05 LAB — HEPATIC FUNCTION PANEL
ALK PHOS: 69 U/L (ref 38–126)
ALT: 63 U/L — ABNORMAL HIGH (ref 0–44)
AST: 33 U/L (ref 15–41)
Albumin: 2.9 g/dL — ABNORMAL LOW (ref 3.5–5.0)
Bilirubin, Direct: 0.2 mg/dL (ref 0.0–0.2)
Indirect Bilirubin: 0.6 mg/dL (ref 0.3–0.9)
Total Bilirubin: 0.8 mg/dL (ref 0.3–1.2)
Total Protein: 6 g/dL — ABNORMAL LOW (ref 6.5–8.1)

## 2018-05-05 LAB — BASIC METABOLIC PANEL
Anion gap: 6 (ref 5–15)
BUN: 9 mg/dL (ref 8–23)
CO2: 24 mmol/L (ref 22–32)
Calcium: 7.9 mg/dL — ABNORMAL LOW (ref 8.9–10.3)
Chloride: 102 mmol/L (ref 98–111)
Creatinine, Ser: 0.73 mg/dL (ref 0.61–1.24)
GFR calc Af Amer: >60 mL/min (ref >60)
GFR calc non Af Amer: >60 mL/min (ref >60)
Glucose, Bld: 97 mg/dL (ref 70–99)
Potassium: 3.6 mmol/L (ref 3.5–5.1)
Sodium: 132 mmol/L — ABNORMAL LOW (ref 135–145)

## 2018-05-05 LAB — HIV ANTIBODY (ROUTINE TESTING W REFLEX): HIV Screen 4th Generation wRfx: NONREACTIVE

## 2018-05-05 LAB — HEMOGLOBIN: Hemoglobin: 12 g/dL — ABNORMAL LOW (ref 13.0–17.0)

## 2018-05-05 MED ORDER — APIXABAN 5 MG PO TABS
10.0000 mg | ORAL_TABLET | Freq: Two times a day (BID) | ORAL | Status: DC
  Filled 2018-05-05: qty 2

## 2018-05-05 MED ORDER — HEPARIN BOLUS VIA INFUSION
3000.0000 [IU] | Freq: Once | INTRAVENOUS | Status: DC
  Filled 2018-05-05: qty 3000

## 2018-05-05 MED ORDER — APIXABAN 5 MG PO TABS: 5.0000 mg | ORAL_TABLET | Freq: Two times a day (BID) | ORAL | Status: DC

## 2018-05-05 MED ORDER — APIXABAN 5 MG PO TABS
5.0000 mg | ORAL_TABLET | Freq: Two times a day (BID) | ORAL | Status: DC
  Administered 2018-05-05 (×2): 5 mg via ORAL
  Filled 2018-05-05 (×2): qty 1

## 2018-05-05 NOTE — Progress Notes (Signed)
[facility]  Daily Progress Note   Subjective  - 1 Day Post-Op  Patient states his pain is much less.  He has been elevating in bed with blankets underneath the mattress.  He notes that his left foot is hardly swollen.  He has not been out of bed yet.  Objective Vitals:   05/04/18 1957 05/04/18 2211 05/05/18 0011 05/05/18 0804  BP: (!) 103/57 124/67 111/62 124/66  Pulse: 80  78 73  Resp:   19 18  Temp:   99.3 F (37.4 C) 98.7 F (37.1 C)  TempSrc:   Oral Oral  SpO2:   94% 94%  Weight:      Height:        Intake/Output Summary (Last 24 hours) at 05/05/2018 1527 Last data filed at 05/05/2018 1007 Gross per 24 hour  Intake 1303.22 ml  Output 2650 ml  Net -1346.78 ml    PULM  Normal effort , no use of accessory muscles CV  No JVD, RRR Abd      No distended, nontender VASC  left leg is unwrapped.  The ABDs used to reinforce his dressing are completely dry.  Upon removal of the Covan and the Tegaderm dressing there is no bleeding.  The leg is substantially less swollen than it was yesterday.  2+ pedal pulses are maintained.   Laboratory CBC    Component Value Date/Time   WBC 7.8 05/04/2018 0352   HGB 12.0 (L) 05/05/2018 0534   HCT 40.9 05/04/2018 0352   PLT 193 05/04/2018 0352    BMET    Component Value Date/Time   NA 132 (L) 05/05/2018 0534   K 3.6 05/05/2018 0534   CL 102 05/05/2018 0534   CO2 24 05/05/2018 0534   GLUCOSE 97 05/05/2018 0534   BUN 9 05/05/2018 0534   CREATININE 0.73 05/05/2018 0534   CALCIUM 7.9 (L) 05/05/2018 0534   GFRNONAA >60 05/05/2018 0534   GFRAA >60 05/05/2018 0534    Assessment/Planning: POD #1 s/p thrombectomy left lower extremity for DVT with stenting of the iliac vein   Patient has done quite well small amount of oozing noted last night is inconsequential.  His leg is redressed and then wrapped with an Ace wrap.  I will begin his Eliquis and discontinue his heparin.    I have asked that he begin  ambulating.  I will plan for discharge to home tomorrow    John Jenkins  05/05/2018, 3:27 PM

## 2018-05-05 NOTE — Consult Note (Addendum)
    ANTICOAGULATION CONSULT NOTE - Initial Consult  Pharmacy Consult for Eliquis Indication: DVT  No Known Allergies  Patient Measurements: Height: 6\' 4"  (193 cm) Weight: (!) 320 lb (145.2 kg) IBW/kg (Calculated) : 86.8   Vital Signs: Temp: 99.3 F (37.4 C) (12/18 0011) Temp Source: Oral (12/18 0011) BP: 111/62 (12/18 0011) Pulse Rate: 78 (12/18 0011)  Labs: Recent Labs    05/03/18 2005 05/04/18 0352 05/04/18 2023 05/05/18 0534  HGB 14.1 13.5  --  12.0*  HCT 42.3 40.9  --   --   PLT 200 193  --   --   APTT 31  --   --   --   LABPROT 13.3  --   --   --   INR 1.02  --   --   --   HEPARINUNFRC  --  0.40 <0.10* 0.13*  CREATININE 0.74  --   --  0.73    Estimated Creatinine Clearance: 143.5 mL/min (by C-G formula based on SCr of 0.73 mg/dL).   Medical History: Past Medical History:  Diagnosis Date  . Obese   . Pneumonia     Medications:  Currently on heparin drip that will be discontinued with start of Eliquis therapy  Assessment: Pt is post-surgical vascular left leg dvt, continuing heparin drip post-op until starting Eliquis this am.    Goal of Therapy:  Monitor platelets by anticoagulation protocol: Yes   Plan:  DVT treatment dosing - Normally - Eliquis 10mg  bid for 7 days, followed by Eliquis 5mg  bid for > or = 3 months of therapy to be determined by vascular  Vascular entered a starting dose of Eliquis 5mg  bid - Will communicate with vascular to clarify intended dosing.    Heparin drip has stopped, so will go ahead and schedule one 5mg  dose for now and another at bedtime and try to communicate again in the am.  @@12 /19 Update - Per conversation with vascular - will dose 10mg  bid for 6 more days, then go back to 5mg  bid dosing@@   Lu Duffel, PharmD, BCPS Clinical Pharmacist 05/05/2018 7:54 AM

## 2018-05-05 NOTE — Progress Notes (Signed)
ANTICOAGULATION CONSULT NOTE - Follow Up Consult  Pharmacy Consult for Heparin  Indication: DVT  No Known Allergies  Patient Measurements: Height: 6\' 4"  (193 cm) Weight: (!) 320 lb (145.2 kg) IBW/kg (Calculated) : 86.8 Heparin Dosing Weight: 120 kg  Vital Signs: Temp: 99.3 F (37.4 C) (12/18 0011) Temp Source: Oral (12/18 0011) BP: 111/62 (12/18 0011) Pulse Rate: 78 (12/18 0011)  Labs: Recent Labs    05/03/18 2005 05/04/18 0352 05/04/18 2023 05/05/18 0534  HGB 14.1 13.5  --  12.0*  HCT 42.3 40.9  --   --   PLT 200 193  --   --   APTT 31  --   --   --   LABPROT 13.3  --   --   --   INR 1.02  --   --   --   HEPARINUNFRC  --  0.40 <0.10* 0.13*  CREATININE 0.74  --   --  0.73    Estimated Creatinine Clearance: 143.5 mL/min (by C-G formula based on SCr of 0.73 mg/dL).   Medications:  Medications Prior to Admission  Medication Sig Dispense Refill Last Dose  . Ascorbic Acid (VITAMIN C) 100 MG tablet Take 100 mg by mouth daily.   05/03/2018 at 0800  . fluticasone (FLONASE) 50 MCG/ACT nasal spray Place 2 sprays into the nose daily.   prn at prn  . Loratadine 10 MG CAPS Take 10 mg by mouth daily.   05/03/2018 at 0800  . Multiple Vitamins-Minerals (MULTIVITAMIN PO) Take 1 tablet by mouth daily.   05/03/2018 at 0800    Assessment: Pharmacy consulted to dose heparin for DVT in this 67 year old male,  S/P thrombectomy on 12/17 @ 10:00.  CrCl = 120 ml/min Per Dr Delana Meyer, resume heparin gtt at previous rate with no bolus.  Ok to follow nomogram thereafter with boluses.   Goal of Therapy:  Heparin level 0.3-0.7 units/ml Monitor platelets by anticoagulation protocol: Yes   Plan:  12/18 @ 0530 HL 0.13 subtherapeutic. Will increase rate to 2100 units/hr and will recheck HL @ 1400, hgb has trended down to 12.0 will hold off on bolusing for now as patient has had issues w/ bleeding earlier.  Tobie Lords, PharmD, BCPS Clinical Pharmacist 05/05/2018

## 2018-05-05 NOTE — Progress Notes (Signed)
Patient ID: John Jenkins, male   DOB: [birth year], 67 y.o.   MRN: [id number]  [facility] PROGRESS NOTE  John Jenkins:[id number] DOB: December 17, 1950 DOA: 05/03/2018 PCP: John Body, MD  HPI/Subjective: Patient had some bleeding behind his left knee last night.  States he feels okay.  States the swelling is less on his leg.  Objective: Vitals:   05/05/18 0011 05/05/18 0804  BP: 111/62 124/66  Pulse: 78 73  Resp: 19 18  Temp: 99.3 F (37.4 C) 98.7 F (37.1 C)  SpO2: 94% 94%    Filed Weights   05/03/18 1845 05/03/18 2224 05/04/18 0834  Weight: (!) 147 kg (!) 145 kg (!) 145.2 kg    ROS: Review of Systems  Constitutional: Negative for chills and fever.  Eyes: Negative for blurred vision.  Respiratory: Negative for cough and shortness of breath.   Cardiovascular: Negative for chest pain.  Gastrointestinal: Negative for abdominal pain, constipation, diarrhea, nausea and vomiting.  Genitourinary: Negative for dysuria.  Musculoskeletal: Negative for joint pain.  Neurological: Negative for dizziness and headaches.   Exam: Physical Exam  HENT:  Nose: No mucosal edema.  Mouth/Throat: No oropharyngeal exudate or posterior oropharyngeal edema.  Eyes: Pupils are equal, round, and reactive to light. Conjunctivae, EOM and lids are normal.  Neck: No JVD present. Carotid bruit is not present. No edema present. No thyroid mass and no thyromegaly present.  Cardiovascular: S1 normal and S2 normal. Exam reveals no gallop.  No murmur heard. Pulses:      Dorsalis pedis pulses are 2+ on the right side and 2+ on the left side.  Respiratory: No respiratory distress. He has no wheezes. He has no rhonchi. He has no rales.  GI: Soft. Bowel sounds are normal. There is no abdominal tenderness.  Musculoskeletal:     Right ankle: He exhibits swelling.     Left ankle: He exhibits swelling.  Lymphadenopathy:    He has no cervical adenopathy.  Neurological: He is alert. No cranial nerve  deficit.  Skin: Skin is warm. No rash noted. Nails show no clubbing.  Left leg wrapped in compression dressing  Psychiatric: He has a normal mood and affect.      Data Reviewed: Basic Metabolic Panel: Recent Labs  Lab 05/03/18 2005 05/05/18 0534  NA 133* 132*  K 3.8 3.6  CL 101 102  CO2 24 24  GLUCOSE 95 97  BUN 10 9  CREATININE 0.74 0.73  CALCIUM 8.7* 7.9*   Liver Function Tests: Recent Labs  Lab 05/03/18 2005 05/05/18 0534  AST 47* 33  ALT 102* 63*  ALKPHOS 81 69  BILITOT 1.0 0.8  PROT 6.9 6.0*  ALBUMIN 3.2* 2.9*   CBC: Recent Labs  Lab 05/03/18 2005 05/04/18 0352 05/05/18 0534  WBC 9.9 7.8  --   HGB 14.1 13.5 12.0*  HCT 42.3 40.9  --   MCV 94.6 95.6  --   PLT 200 193  --      Studies: US Venous Img Lower Unilateral Left  Result Date: 05/03/2018 CLINICAL DATA:  Left calf pain and swelling for several days EXAM: LEFT LOWER EXTREMITY VENOUS DOPPLER ULTRASOUND TECHNIQUE: Gray-scale sonography with graded compression, as well as color Doppler and duplex ultrasound were performed to evaluate the lower extremity deep venous systems from the level of the common femoral vein and including the common femoral, femoral, profunda femoral, popliteal and calf veins including the posterior tibial, peroneal and gastrocnemius veins when visible. The superficial great saphenous vein was  also interrogated. Spectral Doppler was utilized to evaluate flow at rest and with distal augmentation maneuvers in the common femoral, femoral and popliteal veins. COMPARISON:  None. FINDINGS: Contralateral Common Femoral Vein: Respiratory phasicity is normal and symmetric with the symptomatic side. No evidence of thrombus. Normal compressibility. Common Femoral Vein: Extensive thrombus is noted with decreased compressibility and decreased flow. Saphenofemoral Junction: Thrombus is noted within the saphenous vein at the saphenofemoral junction. Decreased compressibility in flow is noted. Profunda  Femoral Vein: Thrombus is noted with decreased compressibility. Femoral Vein: Thrombus is noted with decreased compressibility and flow. Popliteal Vein: Thrombus is noted with decreased compressibility and flow. Calf Veins: Thrombus is noted with decreased compressibility and flow. Superficial Great Saphenous Vein: Thrombus is noted. Venous Reflux:  None. Other Findings:  None. IMPRESSION: Diffuse deep venous thrombosis within the left leg. Electronically Signed   By: John Jenkins M.D.   On: 05/03/2018 19:55    Scheduled Meds: . loratadine  10 mg Oral Daily  . multivitamin   Oral Daily  . sodium chloride flush  3 mL Intravenous Q12H  . vitamin C  250 mg Oral Daily   Continuous Infusions: . sodium chloride 250 mL (05/05/18 0518)  . heparin 2,100 Units/hr (05/05/18 1761)    Assessment/Plan:  1. Extensive left lower extremity DVT status post thrombolysis and thrombectomy with angioplasties.  By Dr. Delana Jenkins yesterday.  Continue heparin drip for now.  Dr. Delana Jenkins would like to reevaluate prior to switching over to Eliquis since he had some bleeding last night.  Depending on Dr. Nino Jenkins evaluation will depend on when disposition will be.  Hemoglobin did dip down to 12. 2. Obesity with a BMI of 38.95.  Weight loss needed. 3. Elevated liver function test.  Improved with repeat check.  AST normalized.  ALT only slightly high.  Code Status:     Code Status Orders  (From admission, onward)         Start     Ordered   05/03/18 2203  Full code  Continuous     05/03/18 2203        Code Status History    This patient has a current code status but no historical code status.    Advance Directive Documentation     Most Recent Value  Type of Advance Directive  Living will  Pre-existing out of facility DNR order (yellow form or pink MOST form)  -  "MOST" Form in Place?  -     Family Communication: Son yesterday at the bedside Disposition Plan: To be determined based on vascular surgery  evaluation and recommendations  Consultants:  Vascular surgery  Procedures:  Left leg thrombolysis and thrombectomy  Time spent: 32 minutes  [place]

## 2018-05-06 MED ORDER — APIXABAN 5 MG PO TABS: ORAL_TABLET | ORAL | 1 refills | Status: DC

## 2018-05-06 MED ORDER — APIXABAN 5 MG PO TABS: 5.0000 mg | ORAL_TABLET | Freq: Two times a day (BID) | ORAL | Status: DC

## 2018-05-06 MED ORDER — APIXABAN 5 MG PO TABS
10.0000 mg | ORAL_TABLET | Freq: Two times a day (BID) | ORAL | Status: DC
  Administered 2018-05-06: 10 mg via ORAL
  Filled 2018-05-06: qty 2

## 2018-05-06 NOTE — Discharge Summary (Signed)
[facility] [place] NAME: John Jenkins    MR#:  [id number]  DATE OF BIRTH:  July 12, 1950  DATE OF ADMISSION:  05/03/2018 ADMITTING PHYSICIAN: Henreitta Leber, MD  DATE OF DISCHARGE: 05/06/2018  PRIMARY CARE PHYSICIAN: Dion Body, MD    ADMISSION DIAGNOSIS:  Acute deep vein thrombosis (DVT) of proximal vein of left lower extremity ([place]) [I82.4Y2]  DISCHARGE DIAGNOSIS:  Active Problems:   DVT (deep venous thrombosis) ([place])   SECONDARY DIAGNOSIS:   Past Medical History:  Diagnosis Date  . Obese   . Pneumonia     HOSPITAL COURSE:   67 year old obese male with no significant past medical history who presents to the hospital due to left lower extremity swelling and redness.  1.  Left lower extremity DVT-this was the cause of patient's worsening left lower extremity redness and swelling. -This was likely a provoked DVT given patient's poor activity since his recent bout of pneumonia and bronchitis. -Patient was admitted to the hospital treated with IV heparin drip.  A vascular surgery consult was obtained.  Patient underwent thrombectomy of the left lower extremity DVT with a stenting of the left iliac vein.  Patient had some postoperative bleeding near the surgical site which has now resolved.  Swelling and pain of his left leg has significantly improved.  Patient has been taken off IV heparin drip and now switched over to oral Eliquis and being discharged on that.  Patient will follow-up with vascular surgery in the next 2 weeks.  2. Hx of allergic Rhinitis - no acute symptoms.  - pt. Will cont. Flonase, Claritin.   DISCHARGE CONDITIONS:   Stable.   CONSULTS OBTAINED:  Treatment Team:  Henreitta Leber, MD  DRUG ALLERGIES:  No Known Allergies  DISCHARGE MEDICATIONS:   Allergies as of 05/06/2018   No Known Allergies     Medication List    TAKE these medications   apixaban 5 MG Tabs tablet Commonly known as:   ELIQUIS Take 10 mg PO BID X 6 days and then start 5 mg PO BID   fluticasone 50 MCG/ACT nasal spray Commonly known as:  FLONASE Place 2 sprays into the nose daily.   Loratadine 10 MG Caps Take 10 mg by mouth daily.   MULTIVITAMIN PO Take 1 tablet by mouth daily.   vitamin C 100 MG tablet Take 100 mg by mouth daily.         DISCHARGE INSTRUCTIONS:   DIET:  Regular diet  DISCHARGE CONDITION:  Stable  ACTIVITY:  Activity as tolerated  OXYGEN:  Home Oxygen: No.   Oxygen Delivery: room air  DISCHARGE LOCATION:  home   If you experience worsening of your admission symptoms, develop shortness of breath, life threatening emergency, suicidal or homicidal thoughts you must seek medical attention immediately by calling 911 or calling your MD immediately  if symptoms less severe.  You Must read complete instructions/literature along with all the possible adverse reactions/side effects for all the Medicines you take and that have been prescribed to you. Take any new Medicines after you have completely understood and accpet all the possible adverse reactions/side effects.   Please note  You were cared for by a hospitalist during your hospital stay. If you have any questions about your discharge medications or the care you received while you were in the hospital after you are discharged, you can call the unit and asked to speak with the hospitalist on call if the hospitalist that  took care of you is not available. Once you are discharged, your primary care physician will handle any further medical issues. Please note that NO REFILLS for any discharge medications will be authorized once you are discharged, as it is imperative that you return to your primary care physician (or establish a relationship with a primary care physician if you do not have one) for your aftercare needs so that they can reassess your need for medications and monitor your lab values.     Today   Left lower  extremity swelling and pain much improved.  No chest pain, hemoptysis.  No acute bleeding overnight.  Will discharge home today.  VITAL SIGNS:  Blood pressure (!) 145/82, pulse (!) 101, temperature 98 F (36.7 C), resp. rate 20, height 6\' 4"  (1.93 m), weight (!) 145.2 kg, SpO2 100 %.  I/O:    Intake/Output Summary (Last 24 hours) at 05/06/2018 1427 Last data filed at 05/06/2018 0900 Gross per 24 hour  Intake 360 ml  Output 1000 ml  Net -640 ml    PHYSICAL EXAMINATION:  GENERAL:  67 y.o.-year-old obese patient lying in the bed with no acute distress.  EYES: Pupils equal, round, reactive to light and accommodation. No scleral icterus. Extraocular muscles intact.  HEENT: Head atraumatic, normocephalic. Oropharynx and nasopharynx clear.  NECK:  Supple, no jugular venous distention. No thyroid enlargement, no tenderness.  LUNGS: Normal breath sounds bilaterally, no wheezing, rales,rhonchi. No use of accessory muscles of respiration.  CARDIOVASCULAR: S1, S2 normal. No murmurs, rubs, or gallops.  ABDOMEN: Soft, non-tender, non-distended. Bowel sounds present. No organomegaly or mass.  EXTREMITIES: No pedal edema, cyanosis, or clubbing.  Left lower extremity wrapped in ACE wrap.  NEUROLOGIC: Cranial nerves II through XII are intact. No focal motor or sensory defecits b/l.  PSYCHIATRIC: The patient is alert and oriented x 3. SKIN: No obvious rash, lesion, or ulcer.    DATA REVIEW:   CBC Recent Labs  Lab 05/04/18 0352 05/05/18 0534  WBC 7.8  --   HGB 13.5 12.0*  HCT 40.9  --   PLT 193  --     Chemistries  Recent Labs  Lab 05/05/18 0534  NA 132*  K 3.6  CL 102  CO2 24  GLUCOSE 97  BUN 9  CREATININE 0.73  CALCIUM 7.9*  AST 33  ALT 63*  ALKPHOS 69  BILITOT 0.8    Cardiac Enzymes No results for input(s): TROPONINI in the last 168 hours.  Microbiology Results  No results found for this or any previous visit.  RADIOLOGY:  No results found.    Management plans  discussed with the patient, family and they are in agreement.  CODE STATUS:     Code Status Orders  (From admission, onward)         Start     Ordered   05/03/18 2203  Full code  Continuous     05/03/18 2203         TOTAL TIME TAKING CARE OF THIS PATIENT: 40 minutes.    Henreitta Leber M.D on 05/06/2018 at 2:27 PM  Between 7am to 6pm - Pager - [phone]  After 6pm go to [email or web address] - Proofreader  [facility]  Office  [phone]  CC: Primary care physician; Dion Body, MD

## 2018-05-06 NOTE — Progress Notes (Signed)
Discharge summary reviewed with verbal understanding. 1 Rx given upon discharge. Awaiting his ride home.

## 2018-05-06 NOTE — Progress Notes (Signed)
Chaplain responded to a OR, Phone call, and nurses request to complete and AD before patient discharge. Chaplain secured witnesses and notary and educated and completed AD. Pt wanted to tell stories about his life that involved theological and cultural themes. Chaplain was an active listener and asked reflective questioning. Pt has 2 sons is divorced and lost his mother 2 years ago.    05/06/18 1100  Clinical Encounter Type  Visited With Patient  Visit Type Follow-up;Spiritual support  Referral From Nurse  Spiritual Encounters  Spiritual Needs Brochure;Prayer

## 2018-05-06 NOTE — Discharge Instructions (Signed)
Vascular Surgery Discharge Instructions Please keep groin clean and dry. Elevate your leg as much as possible. Encourage activity as tolerated.

## 2018-05-20 ENCOUNTER — Other Ambulatory Visit: Payer: Self-pay | Admitting: Pulmonary Disease

## 2018-05-20 DIAGNOSIS — R0602 Shortness of breath: Secondary | ICD-10-CM

## 2018-05-25 ENCOUNTER — Other Ambulatory Visit (INDEPENDENT_AMBULATORY_CARE_PROVIDER_SITE_OTHER): Payer: Self-pay | Admitting: Vascular Surgery

## 2018-05-25 DIAGNOSIS — Z95820 Peripheral vascular angioplasty status with implants and grafts: Secondary | ICD-10-CM

## 2018-05-26 ENCOUNTER — Encounter (INDEPENDENT_AMBULATORY_CARE_PROVIDER_SITE_OTHER): Payer: Self-pay | Admitting: Vascular Surgery

## 2018-05-26 ENCOUNTER — Ambulatory Visit
Admission: RE | Admit: 2018-05-26 | Discharge: 2018-05-26 | Disposition: A | Discharge status: Home / Self Care | Payer: Medicare HMO | Source: Ambulatory Visit | Attending: Pulmonary Disease | Admitting: Pulmonary Disease

## 2018-05-26 ENCOUNTER — Ambulatory Visit (INDEPENDENT_AMBULATORY_CARE_PROVIDER_SITE_OTHER): Payer: Medicare HMO | Admitting: Vascular Surgery

## 2018-05-26 ENCOUNTER — Ambulatory Visit (INDEPENDENT_AMBULATORY_CARE_PROVIDER_SITE_OTHER): Payer: Medicare HMO

## 2018-05-26 VITALS — BP 124/68 | HR 79 | Resp 16 | Ht 76.0 in | Wt 314.0 lb

## 2018-05-26 DIAGNOSIS — R0602 Shortness of breath: Secondary | ICD-10-CM | POA: Diagnosis not present

## 2018-05-26 DIAGNOSIS — I82402 Acute embolism and thrombosis of unspecified deep veins of left lower extremity: Secondary | ICD-10-CM | POA: Diagnosis not present

## 2018-05-26 DIAGNOSIS — R200 Anesthesia of skin: Secondary | ICD-10-CM

## 2018-05-26 DIAGNOSIS — R600 Localized edema: Secondary | ICD-10-CM | POA: Diagnosis not present

## 2018-05-26 DIAGNOSIS — R202 Paresthesia of skin: Secondary | ICD-10-CM

## 2018-05-26 DIAGNOSIS — Z95820 Peripheral vascular angioplasty status with implants and grafts: Secondary | ICD-10-CM

## 2018-05-26 MED ORDER — IOHEXOL 300 MG/ML  SOLN
75.0000 mL | Freq: Once | INTRAMUSCULAR | Status: AC | PRN
  Administered 2018-05-26: 75 mL via INTRAVENOUS

## 2018-05-26 NOTE — Progress Notes (Signed)
Subjective:    Patient ID: John Jenkins, male    DOB: 03-Feb-1951, 68 y.o.   MRN: [id number] Chief Complaint  Patient presents with  . Follow-up   Patient presents to review vascular studies.  The patient was last seen on May 04, 2018 when he underwent a left lower extremity venous lysis with IVC placement for a left lower extremity extensive DVT at [facility].  The patient presents today without complaint.  The patient has been taking his Eliquis 5 mg twice daily on a daily basis.  Denies any issues with anticoagulation.  The patient has been using an Ace wrap to control the minimal swelling that he is experiencing.  The patient denies any pain or worsening edema to the left lower extremity.  Denies any skin changes or ulcer formation to the left lower extremity.  The patient does note numbness to the bilateral feet which has progressively worsened over the last few years.  Patient denies any claudication-like symptoms, rest pain or ulcer formation to the bilateral legs.  The patient underwent a left lower extremity venous duplex which was notable for difficulty visualizing the left common iliac vein and IVC due to overlying bowel gas and patient body habitus.  No evidence of common femoral vein obstruction to the right extremity.  Left lower extremity no flow was adequately detected within the left leg venous system from the left mid distal external iliac vein to the distal popliteal vein.  Patient denies any shortness of breath or chest pain.  Patient denies any fever, nausea or vomiting.  Review of Systems  Constitutional: Negative.   HENT: Negative.   Eyes: Negative.   Respiratory: Negative.   Cardiovascular:       DVT  Gastrointestinal: Negative.   Endocrine: Negative.   Genitourinary: Negative.   Musculoskeletal: Negative.   Skin: Negative.   Allergic/Immunologic: Negative.   Neurological: Negative.   Hematological: Negative.   Psychiatric/Behavioral:  Negative.       Objective:   Physical Exam Vitals signs reviewed.  Constitutional:      Appearance: Normal appearance.  HENT:     Head: Normocephalic and atraumatic.     Right Ear: External ear normal.     Left Ear: External ear normal.     Nose: Nose normal.     Mouth/Throat:     Mouth: Mucous membranes are dry.     Pharynx: Oropharynx is clear.  Eyes:     Extraocular Movements: Extraocular movements intact.     Conjunctiva/sclera: Conjunctivae normal.     Pupils: Pupils are equal, round, and reactive to light.  Neck:     Musculoskeletal: Normal range of motion.  Cardiovascular:     Rate and Rhythm: Normal rate and regular rhythm.     Comments: Hard to palpate pedal pulses due to body habitus and mild edema however the feet are warm Pulmonary:     Effort: Pulmonary effort is normal.     Breath sounds: Normal breath sounds.  Musculoskeletal: Normal range of motion.        General: Swelling (Right lower extremity: Minimal to mild edema, left lower extremity: Mild nonpitting edema) present.  Skin:    General: Skin is warm and dry.     Coloration: Skin is not pale.     Findings: No erythema.  Neurological:     General: No focal deficit present.     Mental Status: He is alert and oriented to person, place, and time.  Psychiatric:  Mood and Affect: Mood normal.        Behavior: Behavior normal.        Thought Content: Thought content normal.        Judgment: Judgment normal.    BP 124/68 (BP Location: Right Arm, Patient Position: Sitting, Cuff Size: Large)   Pulse 79   Resp 16   Ht 6\' 4"  (1.93 m)   Wt (!) 314 lb (142.4 kg)   BMI 38.22 kg/m   Past Medical History:  Diagnosis Date  . Obese   . Pneumonia    Social History   Socioeconomic History  . Marital status: Married    Spouse name: Not on file  . Number of children: Not on file  . Years of education: Not on file  . Highest education level: Not on file  Occupational History  . Not on file  Social  Needs  . Financial resource strain: Not on file  . Food insecurity:    Worry: Not on file    Inability: Not on file  . Transportation needs:    Medical: Not on file    Non-medical: Not on file  Tobacco Use  . Smoking status: Never Smoker  . Smokeless tobacco: Never Used  Substance and Sexual Activity  . Alcohol use: Never    Frequency: Never  . Drug use: Never  . Sexual activity: Not on file  Lifestyle  . Physical activity:    Days per week: Not on file    Minutes per session: Not on file  . Stress: Not on file  Relationships  . Social connections:    Talks on phone: Not on file    Gets together: Not on file    Attends religious service: Not on file    Active member of club or organization: Not on file    Attends meetings of clubs or organizations: Not on file    Relationship status: Not on file  . Intimate partner violence:    Fear of current or ex partner: Not on file    Emotionally abused: Not on file    Physically abused: Not on file    Forced sexual activity: Not on file  Other Topics Concern  . Not on file  Social History Narrative  . Not on file   Past Surgical History:  Procedure Laterality Date  . PERIPHERAL VASCULAR THROMBECTOMY Left 05/04/2018   Procedure: PERIPHERAL VASCULAR THROMBECTOMY;  Surgeon: Katha Cabal, MD;  Location: [place] [facility];  Service: Cardiovascular;  Laterality: Left;   Family History  Problem Relation Age of Onset  . Heart disease Mother   . Heart attack Father    No Known Allergies     Assessment & Plan:  Patient presents to review vascular studies.  The patient was last seen on May 04, 2018 when he underwent a left lower extremity venous lysis with IVC placement for a left lower extremity extensive DVT at [facility].  The patient presents today without complaint.  The patient has been taking his Eliquis 5 mg twice daily on a daily basis.  Denies any issues with anticoagulation.  The patient  has been using an Ace wrap to control the minimal swelling that he is experiencing.  The patient denies any pain or worsening edema to the left lower extremity.  Denies any skin changes or ulcer formation to the left lower extremity.  The patient does note numbness to the bilateral feet which has progressively worsened over the last  few years.  Patient denies any claudication-like symptoms, rest pain or ulcer formation to the bilateral legs.  The patient underwent a left lower extremity venous duplex which was notable for difficulty visualizing the left common iliac vein and IVC due to overlying bowel gas and patient body habitus.  No evidence of common femoral vein obstruction to the right extremity.  Left lower extremity no flow was adequately detected within the left leg venous system from the left mid distal external iliac vein to the distal popliteal vein.  Patient denies any shortness of breath or chest pain.  Patient denies any fever, nausea or vomiting.  1. Deep vein thrombosis (DVT) of left lower extremity, unspecified chronicity, unspecified vein (HCC) - Stable Patient is status post a left lower extremity venous lysis on May 04, 2018 Patient presents today essentially symptom-free with a relatively unremarkable physical exam Venous duplex notable for occlusive DVT from the mid distal external iliac vein to the distal popliteal vein The patient should continue to take his Eliquis 5 mg twice daily. He understands that he will be on this medication for at least 6 months It is okay for the patient to transition to compression socks.  The patient was given a prescription for medical grade 1 compression socks The patient is to continue to elevate his legs heart level or higher as much as possible The patient was encouraged to start to increase his activity level I will see the patient back in approximately 6 to 8 weeks to discuss removal of his IVC filter The patient is to seek medical  attention immediately at the emergency department if he should experience any shortness of breath or chest pain.  - VAS [place] LOWER EXTREMITY VENOUS (DVT); Future  2. Bilateral lower extremity edema - Stable The patient was encouraged to wear graduated compression stockings (20-30 mmHg) on a daily basis. The patient was instructed to begin wearing the stockings first thing in the morning and removing them in the evening. The patient was instructed specifically not to sleep in the stockings. Prescription given.  In addition, behavioral modification including elevation during the day will be initiated. Information on compression stockings was given to the patient. The patient was instructed to call the office in the interim if any worsening edema or ulcerations to the legs, feet or toes occurs. The patient expresses their understanding.  3. Numbness and tingling of both feet - New The patient is complaining of numbness and tingling to the bilateral feet Unable to palpate pedal pulses on exam I will order baseline ABI to rule out any contributing peripheral artery disease  - VAS [place] ABI WITH/WO TBI; Future  Current Outpatient Medications on File Prior to Visit  Medication Sig Dispense Refill  . apixaban (ELIQUIS) 5 MG TABS tablet Take 10 mg PO BID X 6 days and then start 5 mg PO BID 90 tablet 1  . Ascorbic Acid (VITAMIN C) 100 MG tablet Take 100 mg by mouth daily.    Marland Kitchen CALCIUM PO Take by mouth.    . COLLAGEN PO Take by mouth.    . fluticasone (FLONASE) 50 MCG/ACT nasal spray Place 2 sprays into the nose daily.    . Loratadine 10 MG CAPS Take 10 mg by mouth daily.    . Multiple Vitamins-Minerals (MULTIVITAMIN PO) Take 1 tablet by mouth daily.    . Omega-3 Fatty Acids (FISH OIL PO) Take by mouth.    . Potassium (POTASSIMIN PO) Take by mouth.     No  current facility-administered medications on file prior to visit.    There are no Patient Instructions on file for this visit. No follow-ups on  file.  Jacee Enerson A Chukwuemeka Artola, PA-C

## 2018-06-01 ENCOUNTER — Other Ambulatory Visit (HOSPITAL_COMMUNITY): Payer: Self-pay | Admitting: Pulmonary Disease

## 2018-06-01 ENCOUNTER — Other Ambulatory Visit: Payer: Self-pay | Admitting: Pulmonary Disease

## 2018-06-01 DIAGNOSIS — R918 Other nonspecific abnormal finding of lung field: Secondary | ICD-10-CM

## 2018-06-01 DIAGNOSIS — R0602 Shortness of breath: Secondary | ICD-10-CM

## 2018-06-01 DIAGNOSIS — E041 Nontoxic single thyroid nodule: Secondary | ICD-10-CM

## 2018-06-01 DIAGNOSIS — J189 Pneumonia, unspecified organism: Secondary | ICD-10-CM

## 2018-06-04 ENCOUNTER — Ambulatory Visit: Payer: Medicare HMO

## 2018-06-07 ENCOUNTER — Ambulatory Visit: Payer: Medicare HMO

## 2018-06-09 ENCOUNTER — Encounter
Admission: RE | Admit: 2018-06-09 | Discharge: 2018-06-09 | Disposition: A | Discharge status: Home / Self Care | Payer: Medicare HMO | Source: Ambulatory Visit | Attending: Pulmonary Disease | Admitting: Pulmonary Disease

## 2018-06-09 DIAGNOSIS — E041 Nontoxic single thyroid nodule: Secondary | ICD-10-CM | POA: Diagnosis not present

## 2018-06-09 DIAGNOSIS — R918 Other nonspecific abnormal finding of lung field: Secondary | ICD-10-CM | POA: Diagnosis present

## 2018-06-09 DIAGNOSIS — R0602 Shortness of breath: Secondary | ICD-10-CM | POA: Diagnosis present

## 2018-06-09 DIAGNOSIS — J189 Pneumonia, unspecified organism: Secondary | ICD-10-CM | POA: Insufficient documentation

## 2018-06-09 LAB — GLUCOSE, CAPILLARY: Glucose-Capillary: 76 mg/dL (ref 70–99)

## 2018-06-09 MED ORDER — FLUDEOXYGLUCOSE F - 18 (FDG) INJECTION
16.0000 | Freq: Once | INTRAVENOUS | Status: AC | PRN
  Administered 2018-06-09: 16.7 via INTRAVENOUS

## 2018-06-11 ENCOUNTER — Other Ambulatory Visit: Payer: Self-pay

## 2018-06-11 ENCOUNTER — Encounter: Payer: Self-pay | Admitting: Cardiothoracic Surgery

## 2018-06-11 ENCOUNTER — Ambulatory Visit: Payer: Medicare HMO | Admitting: Cardiothoracic Surgery

## 2018-06-11 VITALS — BP 132/82 | HR 88 | Temp 95.5°F | Resp 18 | Ht 76.0 in | Wt 313.2 lb

## 2018-06-11 DIAGNOSIS — J849 Interstitial pulmonary disease, unspecified: Secondary | ICD-10-CM | POA: Diagnosis not present

## 2018-06-11 DIAGNOSIS — Z0120 Encounter for dental examination and cleaning without abnormal findings: Secondary | ICD-10-CM | POA: Insufficient documentation

## 2018-06-11 NOTE — H&P (View-Only) (Signed)
Patient ID: John Jenkins, male   DOB: Jul 30, 1950, 68 y.o.   MRN: [id number]  Chief Complaint  Patient presents with  . New Patient (Initial Visit)    LLL mass    Referred By Dr. Lanney Gins Reason for Referral lung biopsy  HPI Location, Quality, Duration, Severity, Timing, Context, Modifying Factors, Associated Signs and Symptoms.  John Jenkins is a 68 y.o. male.  Several months ago he began experiencing episodes of what he called fatigue associated with a feeling of being unwell.  He did not have any particular cough for sputum production but a chest x-Azaiah was made and this revealed a bilateral pneumonia.  He was subsequently treated with several rounds of oral antibiotics as well as a short course of prednisone.  He continued to have symptoms throughout his course on antibiotics but the prednisone did help some.  He had a CT scan the PET scan made for further evaluation and these revealed bilateral ill-defined pulmonary nodules that were felt to be consistent with an inflammatory process.  On PET scan there was some uptake in these areas and a malignancy could not be totally excluded.  He was sent here to discuss lung biopsy.  Of note is that he had a DVT and underwent a thrombectomy and lysis of clot by Dr. Ella Jubilee several weeks ago.  He is on Eliquis at the present time.   Past Medical History:  Diagnosis Date  . DVT (deep venous thrombosis) ([place]) 2019  . Obese   . Pneumonia     Past Surgical History:  Procedure Laterality Date  . ANKLE SURGERY  1974  . broken arm  1970  . PERIPHERAL VASCULAR THROMBECTOMY Left 05/04/2018   Procedure: PERIPHERAL VASCULAR THROMBECTOMY;  Surgeon: Katha Cabal, MD;  Location: [place] [facility];  Service: Cardiovascular;  Laterality: Left;    Family History  Problem Relation Age of Onset  . Heart disease Mother   . Heart attack Father     Social History Social History   Tobacco Use  . Smoking status: Never Smoker  . Smokeless  tobacco: Never Used  Substance Use Topics  . Alcohol use: Never    Frequency: Never  . Drug use: Never    No Known Allergies  Current Outpatient Medications  Medication Sig Dispense Refill  . apixaban (ELIQUIS) 5 MG TABS tablet Take 10 mg PO BID X 6 days and then start 5 mg PO BID 90 tablet 1  . Ascorbic Acid (VITAMIN C) 100 MG tablet Take 100 mg by mouth daily.    Marland Kitchen CALCIUM PO Take by mouth.    . COLLAGEN PO Take by mouth.    . fluticasone (FLONASE) 50 MCG/ACT nasal spray Place 2 sprays into the nose daily.    . Loratadine 10 MG CAPS Take 10 mg by mouth daily.    . Multiple Vitamins-Minerals (MULTIVITAMIN PO) Take 1 tablet by mouth daily.    . Omega-3 Fatty Acids (FISH OIL PO) Take by mouth.    . Potassium (POTASSIMIN PO) Take by mouth.     No current facility-administered medications for this visit.       Review of Systems A complete review of systems was asked and was negative except for the following positive findings intentional weight loss of 50 pounds over the last year  Blood pressure 132/82, pulse 88, temperature (!) 95.5 F (35.3 C), temperature source Temporal, resp. rate 18, height 6\' 4"  (1.93 m), weight (!) 313 lb 3.2 oz (142.1  kg), SpO2 98 %.  Physical Exam CONSTITUTIONAL:  Pleasant, well-developed, well-nourished, and in no acute distress. EYES: Pupils equal and reactive to light, Sclera non-icteric EARS, NOSE, MOUTH AND THROAT:  The oropharynx was clear.  Dentition is good repair.  Oral mucosa pink and moist. LYMPH NODES:  Lymph nodes in the neck and axillae were normal RESPIRATORY:  Lungs were clear.  Normal respiratory effort without pathologic use of accessory muscles of respiration CARDIOVASCULAR: Heart was regular without murmurs.  There were no carotid bruits. GI: The abdomen was soft, nontender, and nondistended. There were no palpable masses. There was no hepatosplenomegaly. There were normal bowel sounds in all quadrants. GU:  Rectal deferred.    MUSCULOSKELETAL:  Normal muscle strength and tone.  No clubbing or cyanosis.   SKIN:  There were no pathologic skin lesions.  There were no nodules on palpation. NEUROLOGIC:  Sensation is normal.  Cranial nerves are grossly intact. PSYCH:  Oriented to person, place and time.  Mood and affect are normal.  Data Reviewed CT scan and PET scan  I have personally reviewed the patient's imaging, laboratory findings and medical records.    Assessment    Bilateral ill-defined pulmonary nodules consistent with organizing pneumonia    Plan    I had a long discussion with him today regarding the indications and risks of thoracoscopy possible thoracotomy with lung biopsy.  I explained to him that the lesions that were present were both in the upper and lower lobes on the left as well as on the right side.  I reviewed with him the option of thoracoscopic lung biopsy including the risks of bleeding, infection, air leak and death.  He is on Eliquis and this will need to be stopped prior to any intervention.  We will contact Dr. Nino Parsley office to see if he believes we can stop his Eliquis prior to surgery.  Once we have clearance for that we can then schedule the appropriate procedure.       Nestor Lewandowsky, MD 06/11/2018, 11:36 AM

## 2018-06-11 NOTE — Patient Instructions (Addendum)
We will schedule your Thorascopic Lung Biopsy.   You are scheduled for surgery with Dr Genevive Bi at [facility] on 07/09/18. You will pre admit at the hospital and we will call you with that time and date.   We will send for Eliquis clearance to Dr.Schnier today.

## 2018-06-11 NOTE — Progress Notes (Signed)
Patient ID: John Jenkins, male   DOB: 28-Sep-1950, 68 y.o.   MRN: [id number]  Chief Complaint  Patient presents with  . New Patient (Initial Visit)    LLL mass    Referred By Dr. Lanney Gins Reason for Referral lung biopsy  HPI Location, Quality, Duration, Severity, Timing, Context, Modifying Factors, Associated Signs and Symptoms.  John Jenkins is a 68 y.o. male.  Several months ago he began experiencing episodes of what he called fatigue associated with a feeling of being unwell.  He did not have any particular cough for sputum production but a chest x-Jude was made and this revealed a bilateral pneumonia.  He was subsequently treated with several rounds of oral antibiotics as well as a short course of prednisone.  He continued to have symptoms throughout his course on antibiotics but the prednisone did help some.  He had a CT scan the PET scan made for further evaluation and these revealed bilateral ill-defined pulmonary nodules that were felt to be consistent with an inflammatory process.  On PET scan there was some uptake in these areas and a malignancy could not be totally excluded.  He was sent here to discuss lung biopsy.  Of note is that he had a DVT and underwent a thrombectomy and lysis of clot by Dr. Ella Jubilee several weeks ago.  He is on Eliquis at the present time.   Past Medical History:  Diagnosis Date  . DVT (deep venous thrombosis) ([place]) 2019  . Obese   . Pneumonia     Past Surgical History:  Procedure Laterality Date  . ANKLE SURGERY  1974  . broken arm  1970  . PERIPHERAL VASCULAR THROMBECTOMY Left 05/04/2018   Procedure: PERIPHERAL VASCULAR THROMBECTOMY;  Surgeon: Katha Cabal, MD;  Location: [place] [facility];  Service: Cardiovascular;  Laterality: Left;    Family History  Problem Relation Age of Onset  . Heart disease Mother   . Heart attack Father     Social History Social History   Tobacco Use  . Smoking status: Never Smoker  . Smokeless  tobacco: Never Used  Substance Use Topics  . Alcohol use: Never    Frequency: Never  . Drug use: Never    No Known Allergies  Current Outpatient Medications  Medication Sig Dispense Refill  . apixaban (ELIQUIS) 5 MG TABS tablet Take 10 mg PO BID X 6 days and then start 5 mg PO BID 90 tablet 1  . Ascorbic Acid (VITAMIN C) 100 MG tablet Take 100 mg by mouth daily.    Marland Kitchen CALCIUM PO Take by mouth.    . COLLAGEN PO Take by mouth.    . fluticasone (FLONASE) 50 MCG/ACT nasal spray Place 2 sprays into the nose daily.    . Loratadine 10 MG CAPS Take 10 mg by mouth daily.    . Multiple Vitamins-Minerals (MULTIVITAMIN PO) Take 1 tablet by mouth daily.    . Omega-3 Fatty Acids (FISH OIL PO) Take by mouth.    . Potassium (POTASSIMIN PO) Take by mouth.     No current facility-administered medications for this visit.       Review of Systems A complete review of systems was asked and was negative except for the following positive findings intentional weight loss of 50 pounds over the last year  Blood pressure 132/82, pulse 88, temperature (!) 95.5 F (35.3 C), temperature source Temporal, resp. rate 18, height 6\' 4"  (1.93 m), weight (!) 313 lb 3.2 oz (142.1  kg), SpO2 98 %.  Physical Exam CONSTITUTIONAL:  Pleasant, well-developed, well-nourished, and in no acute distress. EYES: Pupils equal and reactive to light, Sclera non-icteric EARS, NOSE, MOUTH AND THROAT:  The oropharynx was clear.  Dentition is good repair.  Oral mucosa pink and moist. LYMPH NODES:  Lymph nodes in the neck and axillae were normal RESPIRATORY:  Lungs were clear.  Normal respiratory effort without pathologic use of accessory muscles of respiration CARDIOVASCULAR: Heart was regular without murmurs.  There were no carotid bruits. GI: The abdomen was soft, nontender, and nondistended. There were no palpable masses. There was no hepatosplenomegaly. There were normal bowel sounds in all quadrants. GU:  Rectal deferred.    MUSCULOSKELETAL:  Normal muscle strength and tone.  No clubbing or cyanosis.   SKIN:  There were no pathologic skin lesions.  There were no nodules on palpation. NEUROLOGIC:  Sensation is normal.  Cranial nerves are grossly intact. PSYCH:  Oriented to person, place and time.  Mood and affect are normal.  Data Reviewed CT scan and PET scan  I have personally reviewed the patient's imaging, laboratory findings and medical records.    Assessment    Bilateral ill-defined pulmonary nodules consistent with organizing pneumonia    Plan    I had a long discussion with him today regarding the indications and risks of thoracoscopy possible thoracotomy with lung biopsy.  I explained to him that the lesions that were present were both in the upper and lower lobes on the left as well as on the right side.  I reviewed with him the option of thoracoscopic lung biopsy including the risks of bleeding, infection, air leak and death.  He is on Eliquis and this will need to be stopped prior to any intervention.  We will contact Dr. Nino Parsley office to see if he believes we can stop his Eliquis prior to surgery.  Once we have clearance for that we can then schedule the appropriate procedure.       Nestor Lewandowsky, MD 06/11/2018, 11:36 AM

## 2018-06-14 ENCOUNTER — Telehealth: Payer: Self-pay

## 2018-06-14 NOTE — Telephone Encounter (Signed)
Call to the patient to see about rescheduling his surgery with Dr Genevive Bi scheduled for 06/30/18. He is available to do the surgery on 06/29/18 if the patient is amendable to this.  We have received Coagulation clearance from [facility].

## 2018-06-14 NOTE — Telephone Encounter (Signed)
The patient is aware of time and date.

## 2018-06-14 NOTE — Telephone Encounter (Signed)
Message left for patient informing him that he will pre admit in the [facility] [place] testing on 06/22/18 at 9:45 am. He has been asked to call back to confirm that he received this message.

## 2018-06-15 NOTE — Telephone Encounter (Signed)
Spoke with the patient about rescheduling his surgery. His surgery is rescheduled to 06/29/18. Dr Caroleen Hamman will be assisting with this case. The patient is aware to stop his Eliquis 5 days prior. His last day will be on the 06/23/18.

## 2018-06-17 ENCOUNTER — Telehealth (INDEPENDENT_AMBULATORY_CARE_PROVIDER_SITE_OTHER): Payer: Self-pay | Admitting: Vascular Surgery

## 2018-06-17 NOTE — Telephone Encounter (Signed)
Spoke with pt and informed him of Fallon's message. Pt understood and had no additional questions at this time

## 2018-06-17 NOTE — Telephone Encounter (Signed)
Pt left vm on nurse line stating that he was approved to be off his Eliquis for x 5 days prior to lung bx. He just wants to know while he is off his medication does he have any additional restrictions such as limiting his work out or not working out at all or elevating his legs as much as he can.

## 2018-06-17 NOTE — Telephone Encounter (Signed)
He can continue with all normal activities. No need to limit them.  He should be elevating his lower extremities when not active, as well as wearing compression stockings daily.

## 2018-06-22 ENCOUNTER — Other Ambulatory Visit: Payer: Self-pay | Admitting: Cardiothoracic Surgery

## 2018-06-22 ENCOUNTER — Other Ambulatory Visit: Payer: Self-pay

## 2018-06-22 ENCOUNTER — Encounter
Admission: RE | Admit: 2018-06-22 | Discharge: 2018-06-22 | Disposition: A | Discharge status: Home / Self Care | Payer: Medicare HMO | Source: Ambulatory Visit | Attending: Cardiothoracic Surgery | Admitting: Cardiothoracic Surgery

## 2018-06-22 ENCOUNTER — Ambulatory Visit
Admission: RE | Admit: 2018-06-22 | Discharge: 2018-06-22 | Disposition: A | Discharge status: Home / Self Care | Payer: Medicare HMO | Source: Ambulatory Visit | Attending: Cardiothoracic Surgery | Admitting: Cardiothoracic Surgery

## 2018-06-22 ENCOUNTER — Ambulatory Visit
Admission: RE | Admit: 2018-06-22 | Discharge: 2018-06-22 | Disposition: A | Discharge status: Home / Self Care | Payer: Self-pay | Source: Ambulatory Visit | Attending: Cardiothoracic Surgery | Admitting: Cardiothoracic Surgery

## 2018-06-22 DIAGNOSIS — J849 Interstitial pulmonary disease, unspecified: Secondary | ICD-10-CM

## 2018-06-22 DIAGNOSIS — I82429 Acute embolism and thrombosis of unspecified iliac vein: Secondary | ICD-10-CM | POA: Insufficient documentation

## 2018-06-22 DIAGNOSIS — R918 Other nonspecific abnormal finding of lung field: Secondary | ICD-10-CM | POA: Insufficient documentation

## 2018-06-22 DIAGNOSIS — Z01818 Encounter for other preprocedural examination: Secondary | ICD-10-CM | POA: Insufficient documentation

## 2018-06-22 DIAGNOSIS — E7800 Pure hypercholesterolemia, unspecified: Secondary | ICD-10-CM | POA: Insufficient documentation

## 2018-06-22 HISTORY — DX: Other seasonal allergic rhinitis: J30.2

## 2018-06-22 LAB — BASIC METABOLIC PANEL
Anion gap: 6 (ref 5–15)
BUN: 15 mg/dL (ref 8–23)
CO2: 26 mmol/L (ref 22–32)
Calcium: 9.1 mg/dL (ref 8.9–10.3)
Chloride: 106 mmol/L (ref 98–111)
Creatinine, Ser: 0.7 mg/dL (ref 0.61–1.24)
GFR calc Af Amer: >60 mL/min (ref >60)
GFR calc non Af Amer: >60 mL/min (ref >60)
GLUCOSE: 91 mg/dL (ref 70–99)
Potassium: 3.9 mmol/L (ref 3.5–5.1)
Sodium: 138 mmol/L (ref 135–145)

## 2018-06-22 LAB — CBC
HCT: 44.7 % (ref 39.0–52.0)
Hemoglobin: 14.9 g/dL (ref 13.0–17.0)
MCH: 31.4 pg (ref 26.0–34.0)
MCHC: 33.3 g/dL (ref 30.0–36.0)
MCV: 94.1 fL (ref 80.0–100.0)
Platelets: 202 10*3/uL (ref 150–400)
RBC: 4.75 MIL/uL (ref 4.22–5.81)
RDW: 13.1 % (ref 11.5–15.5)
WBC: 7.4 10*3/uL (ref 4.0–10.5)
nRBC: 0 % (ref 0.0–0.2)

## 2018-06-22 LAB — URINALYSIS, COMPLETE (UACMP) WITH MICROSCOPIC
Bacteria, UA: NONE SEEN
Bilirubin Urine: NEGATIVE
Glucose, UA: NEGATIVE mg/dL
Hgb urine dipstick: NEGATIVE
Ketones, ur: NEGATIVE mg/dL
Leukocytes, UA: NEGATIVE
Nitrite: NEGATIVE
Protein, ur: NEGATIVE mg/dL
Specific Gravity, Urine: 1.009 (ref 1.005–1.030)
Squamous Epithelial / LPF: NONE SEEN (ref 0–5)
pH: 6 (ref 5.0–8.0)

## 2018-06-22 LAB — TYPE AND SCREEN
ABO/RH(D): O POS
ANTIBODY SCREEN: NEGATIVE

## 2018-06-22 LAB — PROTIME-INR
INR: 1.13
Prothrombin Time: 14.4 seconds (ref 11.4–15.2)

## 2018-06-22 LAB — SURGICAL PCR SCREEN
MRSA, PCR: POSITIVE — AB
Staphylococcus aureus: POSITIVE — AB

## 2018-06-22 LAB — APTT: aPTT: 34 seconds (ref 24–36)

## 2018-06-22 MED ORDER — CHLORHEXIDINE GLUCONATE CLOTH 2 % EX PADS
6.0000 | MEDICATED_PAD | Freq: Once | CUTANEOUS | Status: DC
  Filled 2018-06-22: qty 6

## 2018-06-22 NOTE — Pre-Procedure Instructions (Signed)
Called office for orders.

## 2018-06-22 NOTE — Patient Instructions (Addendum)
Your procedure is scheduled on: 06/29/18 Tues Report to Same Day Surgery 2nd floor medical mall [facility] Entrance-take elevator on left to 2nd floor.  Check in with surgery information desk.) To find out your arrival time please call [phone] between 1PM - 3PM on 06/28/18 Mon  Remember: Instructions that are not followed completely may result in serious medical risk, up to and including death, or upon the discretion of your surgeon and anesthesiologist your surgery may need to be rescheduled.    _x___ 1. Do not eat food after midnight the night before your procedure. You may drink clear liquids up to 2 hours before you are scheduled to arrive at the hospital for your procedure.  Do not drink clear liquids within 2 hours of your scheduled arrival to the hospital.  Clear liquids include  --Water or Apple juice without pulp  --Clear carbohydrate beverage such as ClearFast or Gatorade  --Black Coffee or Clear Tea (No milk, no creamers, do not add anything to                  the coffee or Tea Type 1 and type 2 diabetics should only drink water.   ____Ensure clear carbohydrate drink on the way to the hospital for bariatric patients  ____Ensure clear carbohydrate drink 3 hours before surgery for Dr Dwyane Luo patients if physician instructed.   No gum chewing or hard candies.     __x__ 2. No Alcohol for 24 hours before or after surgery.   __x__3. No Smoking or e-cigarettes for 24 prior to surgery.  Do not use any chewable tobacco products for at least 6 hour prior to surgery   ____  4. Bring all medications with you on the day of surgery if instructed.    __x__ 5. Notify your doctor if there is any change in your medical condition     (cold, fever, infections).    x___6. On the morning of surgery brush your teeth with toothpaste and water.  You may rinse your mouth with mouth wash if you wish.  Do not swallow any toothpaste or mouthwash.   Do not wear jewelry, make-up, hairpins,  clips or nail polish.  Do not wear lotions, powders, or perfumes. You may wear deodorant.  Do not shave 48 hours prior to surgery. Men may shave face and neck.  Do not bring valuables to the hospital.    [facility] is not responsible for any belongings or valuables.               Contacts, dentures or bridgework may not be worn into surgery.  Leave your suitcase in the car. After surgery it may be brought to your room.  For patients admitted to the hospital, discharge time is determined by your                       treatment team.  _  Patients discharged the day of surgery will not be allowed to drive home.  You will need someone to drive you home and stay with you the night of your procedure.    Please read over the following fact sheets that you were given:   [facility] Preparing for Surgery and or MRSA Information   _x___ Take anti-hypertensive listed below, cardiac, seizure, asthma,     anti-reflux and psychiatric medicines. These include:  1. fluticasone (FLONASE) 50 MCG/ACT nasal spray  2.loratadine (CLARITIN) 10 MG tablet  3.  4.  5.  6.  ____Fleets enema or Magnesium Citrate as directed.   _x___ Use CHG Soap or sage wipes as directed on instruction sheet   ____ Use inhalers on the day of surgery and bring to hospital day of surgery  ____ Stop Metformin and Janumet 2 days prior to surgery.    ____ Take 1/2 of usual insulin dose the night before surgery and none on the morning     surgery.   _x___ Follow recommendations from Cardiologist, Pulmonologist or PCP regarding          stopping Aspirin, Coumadin, Plavix ,Eliquis, Effient, or Pradaxa, and Pletal. Stop Eliquis 5 days before surgery  X____Stop Anti-inflammatories such as Advil, Aleve, Ibuprofen, Motrin, Naproxen, Naprosyn, Goodies powders or aspirin products. OK to take Tylenol and                          Celebrex.   _x___ Stop supplements until after surgery.  But may continue Vitamin D, Vitamin B,       and  multivitamin.   ____ Bring C-Pap to the hospital.

## 2018-06-23 LAB — HEPATIC FUNCTION PANEL
ALT: 29 U/L (ref 0–44)
AST: 27 U/L (ref 15–41)
Albumin: 3.9 g/dL (ref 3.5–5.0)
Alkaline Phosphatase: 59 U/L (ref 38–126)
Bilirubin, Direct: 0.1 mg/dL (ref 0.0–0.2)
Indirect Bilirubin: 0.8 mg/dL (ref 0.3–0.9)
Total Bilirubin: 0.9 mg/dL (ref 0.3–1.2)
Total Protein: 7 g/dL (ref 6.5–8.1)

## 2018-06-24 ENCOUNTER — Telehealth: Payer: Self-pay | Admitting: *Deleted

## 2018-06-24 ENCOUNTER — Other Ambulatory Visit: Payer: Self-pay | Admitting: Cardiothoracic Surgery

## 2018-06-24 DIAGNOSIS — J849 Interstitial pulmonary disease, unspecified: Secondary | ICD-10-CM

## 2018-06-24 NOTE — Telephone Encounter (Signed)
Patient contacted today at the request of Dr. Genevive Bi.   Dr. Genevive Bi wishes to move surgery from 06-29-18 to 06-30-18 due to a schedule change.   Patient agreeable.   Dr. Rosana Hoes will be assisting with this case instead of Dr. Dahlia Byes.   Patient aware to stop Eliquis 5 days prior to surgery. The patient verbalizes understanding.   O.R. notified of reschedule.   Message to Angie about date change for pre-cert.

## 2018-06-29 MED ORDER — CEFAZOLIN SODIUM-DEXTROSE 2-4 GM/100ML-% IV SOLN: 2.0000 g | INTRAVENOUS | Status: DC

## 2018-06-29 MED ORDER — VANCOMYCIN HCL IN DEXTROSE 1-5 GM/200ML-% IV SOLN
1000.0000 mg | INTRAVENOUS | Status: AC
  Administered 2018-06-30: 1000 mg via INTRAVENOUS

## 2018-06-30 ENCOUNTER — Encounter: Payer: Self-pay | Admitting: *Deleted

## 2018-06-30 ENCOUNTER — Inpatient Hospital Stay: Payer: Medicare HMO | Admitting: Anesthesiology

## 2018-06-30 ENCOUNTER — Inpatient Hospital Stay
Admission: RE | Admit: 2018-06-30 | Discharge: 2018-07-04 | DRG: 854 | Disposition: A | Discharge status: Home Health | Payer: Medicare HMO | Attending: Cardiothoracic Surgery | Admitting: Cardiothoracic Surgery

## 2018-06-30 ENCOUNTER — Other Ambulatory Visit: Payer: Self-pay

## 2018-06-30 ENCOUNTER — Inpatient Hospital Stay: Payer: Medicare HMO

## 2018-06-30 ENCOUNTER — Encounter
Admission: RE | Disposition: A | Discharge status: Home Health | Payer: Self-pay | Source: Home / Self Care | Attending: Cardiothoracic Surgery

## 2018-06-30 DIAGNOSIS — Z86718 Personal history of other venous thrombosis and embolism: Secondary | ICD-10-CM | POA: Diagnosis not present

## 2018-06-30 DIAGNOSIS — J939 Pneumothorax, unspecified: Secondary | ICD-10-CM | POA: Diagnosis not present

## 2018-06-30 DIAGNOSIS — J849 Interstitial pulmonary disease, unspecified: Secondary | ICD-10-CM | POA: Diagnosis not present

## 2018-06-30 DIAGNOSIS — I4891 Unspecified atrial fibrillation: Secondary | ICD-10-CM | POA: Diagnosis not present

## 2018-06-30 DIAGNOSIS — J8489 Other specified interstitial pulmonary diseases: Secondary | ICD-10-CM | POA: Diagnosis present

## 2018-06-30 DIAGNOSIS — Z7951 Long term (current) use of inhaled steroids: Secondary | ICD-10-CM

## 2018-06-30 DIAGNOSIS — Z7901 Long term (current) use of anticoagulants: Secondary | ICD-10-CM

## 2018-06-30 DIAGNOSIS — Z86711 Personal history of pulmonary embolism: Secondary | ICD-10-CM | POA: Diagnosis not present

## 2018-06-30 DIAGNOSIS — Z09 Encounter for follow-up examination after completed treatment for conditions other than malignant neoplasm: Secondary | ICD-10-CM

## 2018-06-30 DIAGNOSIS — R918 Other nonspecific abnormal finding of lung field: Secondary | ICD-10-CM | POA: Diagnosis not present

## 2018-06-30 DIAGNOSIS — Z8249 Family history of ischemic heart disease and other diseases of the circulatory system: Secondary | ICD-10-CM

## 2018-06-30 DIAGNOSIS — Z8701 Personal history of pneumonia (recurrent): Secondary | ICD-10-CM

## 2018-06-30 DIAGNOSIS — Z978 Presence of other specified devices: Secondary | ICD-10-CM | POA: Diagnosis not present

## 2018-06-30 DIAGNOSIS — Z79899 Other long term (current) drug therapy: Secondary | ICD-10-CM

## 2018-06-30 DIAGNOSIS — I480 Paroxysmal atrial fibrillation: Secondary | ICD-10-CM | POA: Diagnosis not present

## 2018-06-30 DIAGNOSIS — I499 Cardiac arrhythmia, unspecified: Secondary | ICD-10-CM | POA: Diagnosis not present

## 2018-06-30 DIAGNOSIS — B450 Pulmonary cryptococcosis: Principal | ICD-10-CM | POA: Diagnosis present

## 2018-06-30 DIAGNOSIS — Z95820 Peripheral vascular angioplasty status with implants and grafts: Secondary | ICD-10-CM | POA: Diagnosis not present

## 2018-06-30 HISTORY — PX: VIDEO ASSISTED THORACOSCOPY (VATS)/THOROCOTOMY: SHX6173

## 2018-06-30 LAB — ABO/RH: ABO/RH(D): O POS

## 2018-06-30 LAB — ACID FAST SMEAR (AFB, MYCOBACTERIA): Acid Fast Smear: NEGATIVE

## 2018-06-30 SURGERY — VIDEO ASSISTED THORACOSCOPY (VATS)/THOROCOTOMY
Anesthesia: General | Laterality: Left

## 2018-06-30 MED ORDER — PROPOFOL 10 MG/ML IV BOLUS
INTRAVENOUS | Status: AC
  Filled 2018-06-30: qty 40

## 2018-06-30 MED ORDER — LACTATED RINGERS IV SOLN
INTRAVENOUS | Status: DC
  Administered 2018-06-30: 07:00:00 via INTRAVENOUS

## 2018-06-30 MED ORDER — PHENYLEPHRINE HCL 10 MG/ML IJ SOLN
INTRAMUSCULAR | Status: AC
  Filled 2018-06-30: qty 1

## 2018-06-30 MED ORDER — MIDAZOLAM HCL 2 MG/2ML IJ SOLN
INTRAMUSCULAR | Status: AC
  Filled 2018-06-30: qty 2

## 2018-06-30 MED ORDER — BUPIVACAINE HCL (PF) 0.25 % IJ SOLN
INTRAMUSCULAR | Status: AC
  Filled 2018-06-30: qty 30

## 2018-06-30 MED ORDER — FENTANYL CITRATE (PF) 100 MCG/2ML IJ SOLN
INTRAMUSCULAR | Status: DC | PRN
  Administered 2018-06-30 (×2): 50 ug via INTRAVENOUS

## 2018-06-30 MED ORDER — BUPIVACAINE HCL (PF) 0.5 % IJ SOLN: INTRAMUSCULAR | Status: DC | PRN

## 2018-06-30 MED ORDER — TRAMADOL HCL 50 MG PO TABS
50.0000 mg | ORAL_TABLET | Freq: Four times a day (QID) | ORAL | Status: DC
  Administered 2018-06-30 – 2018-07-01 (×7): 100 mg via ORAL
  Administered 2018-07-02 – 2018-07-04 (×7): 50 mg via ORAL
  Filled 2018-06-30 (×2): qty 1
  Filled 2018-06-30 (×3): qty 2
  Filled 2018-06-30 (×3): qty 1
  Filled 2018-06-30: qty 2
  Filled 2018-06-30 (×2): qty 1
  Filled 2018-06-30 (×3): qty 2

## 2018-06-30 MED ORDER — CHLORHEXIDINE GLUCONATE 4 % EX LIQD: 60.0000 mL | Freq: Once | CUTANEOUS | Status: DC

## 2018-06-30 MED ORDER — OXYCODONE HCL 5 MG PO TABS: 5.0000 mg | ORAL_TABLET | Freq: Once | ORAL | Status: DC | PRN

## 2018-06-30 MED ORDER — MIDAZOLAM HCL 2 MG/2ML IJ SOLN
INTRAMUSCULAR | Status: DC | PRN
  Administered 2018-06-30: 2 mg via INTRAVENOUS

## 2018-06-30 MED ORDER — DEXTROSE-NACL 5-0.45 % IV SOLN
INTRAVENOUS | Status: DC
  Administered 2018-06-30 – 2018-07-01 (×2): via INTRAVENOUS

## 2018-06-30 MED ORDER — FENTANYL CITRATE (PF) 100 MCG/2ML IJ SOLN: 25.0000 ug | INTRAMUSCULAR | Status: DC | PRN

## 2018-06-30 MED ORDER — OXYCODONE HCL 5 MG/5ML PO SOLN: 5.0000 mg | Freq: Once | ORAL | Status: DC | PRN

## 2018-06-30 MED ORDER — CHLORHEXIDINE GLUCONATE CLOTH 2 % EX PADS: 6.0000 | MEDICATED_PAD | Freq: Every day | CUTANEOUS | Status: DC

## 2018-06-30 MED ORDER — FAMOTIDINE 20 MG PO TABS
ORAL_TABLET | ORAL | Status: AC
  Filled 2018-06-30: qty 1

## 2018-06-30 MED ORDER — PROMETHAZINE HCL 25 MG/ML IJ SOLN: 6.2500 mg | INTRAMUSCULAR | Status: DC | PRN

## 2018-06-30 MED ORDER — MEPERIDINE HCL 50 MG/ML IJ SOLN: 6.2500 mg | INTRAMUSCULAR | Status: DC | PRN

## 2018-06-30 MED ORDER — ONDANSETRON HCL 4 MG/2ML IJ SOLN
INTRAMUSCULAR | Status: AC
  Filled 2018-06-30: qty 2

## 2018-06-30 MED ORDER — PROPOFOL 10 MG/ML IV BOLUS
INTRAVENOUS | Status: DC | PRN
  Administered 2018-06-30: 200 mg via INTRAVENOUS

## 2018-06-30 MED ORDER — BUPIVACAINE LIPOSOME 1.3 % IJ SUSP
INTRAMUSCULAR | Status: AC
  Filled 2018-06-30: qty 20

## 2018-06-30 MED ORDER — BUPIVACAINE HCL (PF) 0.5 % IJ SOLN
INTRAMUSCULAR | Status: AC
  Filled 2018-06-30: qty 30

## 2018-06-30 MED ORDER — ONDANSETRON HCL 4 MG/2ML IJ SOLN: 4.0000 mg | Freq: Four times a day (QID) | INTRAMUSCULAR | Status: DC | PRN

## 2018-06-30 MED ORDER — ROCURONIUM BROMIDE 100 MG/10ML IV SOLN
INTRAVENOUS | Status: DC | PRN
  Administered 2018-06-30: 5 mg via INTRAVENOUS
  Administered 2018-06-30: 20 mg via INTRAVENOUS
  Administered 2018-06-30: 45 mg via INTRAVENOUS

## 2018-06-30 MED ORDER — ONDANSETRON HCL 4 MG/2ML IJ SOLN
INTRAMUSCULAR | Status: DC | PRN
  Administered 2018-06-30: 4 mg via INTRAVENOUS

## 2018-06-30 MED ORDER — VANCOMYCIN HCL IN DEXTROSE 1-5 GM/200ML-% IV SOLN
INTRAVENOUS | Status: AC
  Filled 2018-06-30: qty 200

## 2018-06-30 MED ORDER — FENTANYL CITRATE (PF) 250 MCG/5ML IJ SOLN
INTRAMUSCULAR | Status: AC
  Filled 2018-06-30: qty 5

## 2018-06-30 MED ORDER — MUPIROCIN 2 % EX OINT
1.0000 "application " | TOPICAL_OINTMENT | Freq: Two times a day (BID) | CUTANEOUS | Status: DC
  Filled 2018-06-30: qty 22

## 2018-06-30 MED ORDER — BUPIVACAINE LIPOSOME 1.3 % IJ SUSP
INTRAMUSCULAR | Status: DC | PRN
  Administered 2018-06-30: 50 mL

## 2018-06-30 MED ORDER — FAMOTIDINE 20 MG PO TABS
20.0000 mg | ORAL_TABLET | Freq: Once | ORAL | Status: AC
  Administered 2018-06-30: 20 mg via ORAL

## 2018-06-30 MED ORDER — CEFAZOLIN SODIUM-DEXTROSE 2-4 GM/100ML-% IV SOLN
INTRAVENOUS | Status: AC
  Filled 2018-06-30: qty 100

## 2018-06-30 MED ORDER — DEXAMETHASONE SODIUM PHOSPHATE 10 MG/ML IJ SOLN
INTRAMUSCULAR | Status: AC
  Filled 2018-06-30: qty 1

## 2018-06-30 MED ORDER — SUGAMMADEX SODIUM 200 MG/2ML IV SOLN
INTRAVENOUS | Status: DC | PRN
  Administered 2018-06-30: 300 mg via INTRAVENOUS

## 2018-06-30 MED ORDER — BISACODYL 5 MG PO TBEC
10.0000 mg | DELAYED_RELEASE_TABLET | Freq: Every day | ORAL | Status: DC
  Administered 2018-06-30 – 2018-07-03 (×3): 10 mg via ORAL
  Filled 2018-06-30 (×3): qty 2

## 2018-06-30 MED ORDER — DEXAMETHASONE SODIUM PHOSPHATE 10 MG/ML IJ SOLN
INTRAMUSCULAR | Status: DC | PRN
  Administered 2018-06-30: 5 mg via INTRAVENOUS

## 2018-06-30 MED ORDER — SODIUM CHLORIDE (PF) 0.9 % IJ SOLN
INTRAMUSCULAR | Status: AC
  Filled 2018-06-30: qty 50

## 2018-06-30 MED ORDER — OXYCODONE-ACETAMINOPHEN 7.5-325 MG PO TABS
1.0000 | ORAL_TABLET | ORAL | Status: DC | PRN
  Administered 2018-06-30: 1 via ORAL
  Administered 2018-07-01 – 2018-07-02 (×3): 2 via ORAL
  Filled 2018-06-30 (×3): qty 2
  Filled 2018-06-30: qty 1

## 2018-06-30 MED ORDER — SUCCINYLCHOLINE CHLORIDE 20 MG/ML IJ SOLN
INTRAMUSCULAR | Status: AC
  Filled 2018-06-30: qty 1

## 2018-06-30 MED ORDER — LIDOCAINE HCL (CARDIAC) PF 100 MG/5ML IV SOSY
PREFILLED_SYRINGE | INTRAVENOUS | Status: DC | PRN
  Administered 2018-06-30: 100 mg via INTRAVENOUS

## 2018-06-30 MED ORDER — ALBUTEROL SULFATE (2.5 MG/3ML) 0.083% IN NEBU
2.5000 mg | INHALATION_SOLUTION | RESPIRATORY_TRACT | Status: DC
  Administered 2018-06-30 – 2018-07-01 (×4): 2.5 mg via RESPIRATORY_TRACT
  Filled 2018-06-30 (×4): qty 3

## 2018-06-30 MED ORDER — TALC (STERITALC) POWDER FOR INTRAPLEURAL USE
INTRAPLEURAL | Status: AC
  Filled 2018-06-30: qty 4

## 2018-06-30 MED ORDER — SUCCINYLCHOLINE CHLORIDE 20 MG/ML IJ SOLN
INTRAMUSCULAR | Status: DC | PRN
  Administered 2018-06-30: 150 mg via INTRAVENOUS

## 2018-06-30 MED ORDER — MORPHINE SULFATE (PF) 2 MG/ML IV SOLN: 1.0000 mg | INTRAVENOUS | Status: DC | PRN

## 2018-06-30 MED ORDER — ROCURONIUM BROMIDE 50 MG/5ML IV SOLN
INTRAVENOUS | Status: AC
  Filled 2018-06-30: qty 2

## 2018-06-30 MED ORDER — SENNOSIDES-DOCUSATE SODIUM 8.6-50 MG PO TABS
1.0000 | ORAL_TABLET | Freq: Every day | ORAL | Status: DC | PRN
  Administered 2018-07-02: 1 via ORAL
  Filled 2018-06-30: qty 1

## 2018-06-30 SURGICAL SUPPLY — 74 items
BENZOIN TINCTURE PRP APPL 2/3 (GAUZE/BANDAGES/DRESSINGS) IMPLANT
BNDG COHESIVE 4X5 TAN STRL (GAUZE/BANDAGES/DRESSINGS) IMPLANT
BRONCHOSCOPE PED SLIM DISP (MISCELLANEOUS) ×2 IMPLANT
CANISTER SUCT 1200ML W/VALVE (MISCELLANEOUS) ×2 IMPLANT
CATH THOR STR 28F  SOFT WA (CATHETERS) ×1
CATH THOR STR 28F SOFT WA (CATHETERS) ×1 IMPLANT
CATH URET ROBINSON 16FR STRL (CATHETERS) ×2 IMPLANT
CHLORAPREP W/TINT 26ML (MISCELLANEOUS) ×4 IMPLANT
CNTNR SPEC 2.5X3XGRAD LEK (MISCELLANEOUS) ×1
CONT SPEC 4OZ STER OR WHT (MISCELLANEOUS) ×1
CONTAINER SPEC 2.5X3XGRAD LEK (MISCELLANEOUS) ×1 IMPLANT
CUTTER ECHEON FLEX ENDO 45 340 (ENDOMECHANICALS) ×2 IMPLANT
DEFOGGER SCOPE WARMER CLEARIFY (MISCELLANEOUS) ×2 IMPLANT
DRAIN CHEST DRY SUCT SGL (MISCELLANEOUS) ×2 IMPLANT
DRAPE C-SECTION (MISCELLANEOUS) ×2 IMPLANT
DRAPE MAG INST 16X20 L/F (DRAPES) ×2 IMPLANT
DRAPE POUCH INSTRU U-SHP 10X18 (DRAPES) ×2 IMPLANT
DRSG OPSITE POSTOP 3X4 (GAUZE/BANDAGES/DRESSINGS) ×6 IMPLANT
DRSG OPSITE POSTOP 4X6 (GAUZE/BANDAGES/DRESSINGS) IMPLANT
DRSG OPSITE POSTOP 4X8 (GAUZE/BANDAGES/DRESSINGS) IMPLANT
DRSG TELFA 3X8 NADH (GAUZE/BANDAGES/DRESSINGS) ×2 IMPLANT
ELECT BLADE 6 FLAT ULTRCLN (ELECTRODE) ×2 IMPLANT
ELECT BLADE 6.5 EXT (BLADE) ×2 IMPLANT
ELECT CAUTERY BLADE TIP 2.5 (TIP) ×2
ELECT REM PT RETURN 9FT ADLT (ELECTROSURGICAL) ×2
ELECTRODE CAUTERY BLDE TIP 2.5 (TIP) ×1 IMPLANT
ELECTRODE REM PT RTRN 9FT ADLT (ELECTROSURGICAL) ×1 IMPLANT
GAUZE SPONGE 4X4 12PLY STRL (GAUZE/BANDAGES/DRESSINGS) ×2 IMPLANT
GLOVE SURG SYN 7.5  E (GLOVE) ×2
GLOVE SURG SYN 7.5 E (GLOVE) ×2 IMPLANT
GOWN STRL REUS W/ TWL LRG LVL3 (GOWN DISPOSABLE) ×4 IMPLANT
GOWN STRL REUS W/TWL LRG LVL3 (GOWN DISPOSABLE) ×4
KIT TURNOVER KIT A (KITS) ×2 IMPLANT
LABEL OR SOLS (LABEL) ×2 IMPLANT
LOOP RED MAXI  1X406MM (MISCELLANEOUS) ×1
LOOP VESSEL MAXI 1X406 RED (MISCELLANEOUS) ×1 IMPLANT
MARKER SKIN DUAL TIP RULER LAB (MISCELLANEOUS) ×2 IMPLANT
NEEDLE SPNL 20GX3.5 QUINCKE YW (NEEDLE) ×2 IMPLANT
NEEDLE SPNL 22GX3.5 QUINCKE BK (NEEDLE) ×2 IMPLANT
NS IRRIG 500ML POUR BTL (IV SOLUTION) ×2 IMPLANT
PACK BASIN MAJOR ARMC (MISCELLANEOUS) ×2 IMPLANT
RELOAD PROXIMATE TA60MM GREEN (ENDOMECHANICALS) IMPLANT
RELOAD STAPLER LINE PROX 30 GR (STAPLE) IMPLANT
SCISSORS METZENBAUM CVD 33 (INSTRUMENTS) IMPLANT
SPONGE KITTNER 5P (MISCELLANEOUS) ×2 IMPLANT
STAPLE RELOAD 2.5MM WHITE (STAPLE) IMPLANT
STAPLE RELOAD 45MM GOLD (STAPLE) ×6 IMPLANT
STAPLER RELOAD LINE PROX 30 GR (STAPLE)
STAPLER SKIN PROX 35W (STAPLE) ×2 IMPLANT
STAPLER VASCULAR ECHELON 35 (CUTTER) IMPLANT
STRIP CLOSURE SKIN 1/2X4 (GAUZE/BANDAGES/DRESSINGS) ×2 IMPLANT
SUT ETHILON 4-0 (SUTURE)
SUT ETHILON 4-0 FS2 18XMFL BLK (SUTURE)
SUT MNCRL AB 3-0 PS2 27 (SUTURE) ×4 IMPLANT
SUT PROLENE 5 0 RB 1 DA (SUTURE) IMPLANT
SUT SILK 0 (SUTURE) ×1
SUT SILK 0 30XBRD TIE 6 (SUTURE) ×1 IMPLANT
SUT SILK 1 SH (SUTURE) ×10 IMPLANT
SUT VIC AB 0 CT1 36 (SUTURE) ×4 IMPLANT
SUT VIC AB 2-0 CT1 27 (SUTURE) ×2
SUT VIC AB 2-0 CT1 TAPERPNT 27 (SUTURE) ×2 IMPLANT
SUT VIC AB 2-0 CT2 27 (SUTURE) ×4 IMPLANT
SUT VICRYL 2 TP 1 (SUTURE) ×8 IMPLANT
SUTURE ETHLN 4-0 FS2 18XMF BLK (SUTURE) IMPLANT
SYR 10ML SLIP (SYRINGE) ×2 IMPLANT
SYR BULB IRRIG 60ML STRL (SYRINGE) ×2 IMPLANT
TAPE CLOTH 3X10 WHT NS LF (GAUZE/BANDAGES/DRESSINGS) ×2 IMPLANT
TAPE TRANSPORE STRL 2 31045 (GAUZE/BANDAGES/DRESSINGS) IMPLANT
TRAY FOLEY MTR SLVR 16FR STAT (SET/KITS/TRAYS/PACK) ×2 IMPLANT
TROCAR FLEXIPATH 20X80 (ENDOMECHANICALS) ×6 IMPLANT
TROCAR FLEXIPATH THORACIC 15MM (ENDOMECHANICALS) IMPLANT
TUBING CONNECTING 10 (TUBING) ×2 IMPLANT
WATER STERILE IRR 1000ML POUR (IV SOLUTION) ×2 IMPLANT
YANKAUER SUCT BULB TIP FLEX NO (MISCELLANEOUS) ×2 IMPLANT

## 2018-06-30 NOTE — Op Note (Signed)
  06/30/2018  9:39 AM  PATIENT:  John Jenkins  68 y.o. male  PRE-OPERATIVE DIAGNOSIS: Interstitial lung disease  POST-OPERATIVE DIAGNOSIS: Interstitial lung disease  PROCEDURE: Left thoracoscopy with biopsy of left lower lobe  SURGEON:  Surgeon(s) and Role:    Nestor Lewandowsky, MD - Primary    * Vickie Epley, MD - Assisting  ASSISTANTS: None  ANESTHESIA: General  INDICATIONS FOR PROCEDURE this is a 68 year old gentleman whose had recurrent episodes of pneumonia and a complete work-up which was felt to represent interstitial lung disease.  He had been treated multiple times but remained symptomatic and he was offered the above named procedure for definitive diagnosis to guide therapy.  The indications and risks of the surgery were explained the patient gives informed consent.  DICTATION: The patient was brought to the operating suite and placed in the supine position.  General endotracheal anesthesia was given through a double-lumen tube.  The patient underwent preoperative bronchoscopy to confirm the tubes in the correct location by our anesthesia colleagues and after this he was turned for left thoracoscopy.  All pressure points were carefully padded.  Patient was prepped and draped in usual sterile fashion.  3 thoracic ports were created accommodating a 20 mm trocar.  These were created in a triangular fashion on the lower aspect of the left hemithorax.  The first port was created anteriorly and approximate the fourth intercostal space.  Once this port was created we then passed the scope and then made the 2 other ports under direct visualization.  Because of the patient's large size some of the ports were not long enough to accommodate the thoracoscopic sleeves and therefore they were not used.  The lungs themselves appeared generally healthy.  They were pink with minimal anthracotic pigment.  There was no intrapleural fluid.  There is no sign of any malignancy or infection.  Using the  long Satinsky clamp with tactile feedback the lower lobe segment was identified and using a an endoscopic stapler a piece of the superior segment of the left lower lobe was resected.  This was brought out through our most inferior port and then was examined.  There was some normal tissue surrounding an obviously abnormal area and this was bisected with half sent for pathology and half sent for cultures.  We also sent a small piece for frozen which confirmed lesional tissue suggestive of an inflammatory process.  Thoracoscopy was again carried out.  Our staple line was hemostatic.  We irrigated the chest and suctioned it dry.  A single 28 French chest tube was positioned to the apex of the chest.  Exparel mixed with Marcaine was then used as a local anesthetic.  The wounds were then closed in multiple layers using running absorbable sutures.  The chest tube was secured with silk.  The patient was then awaken from general endotracheal anesthesia were after sterile dressings were applied to the patient he was transported to the recovery room.   Nestor Lewandowsky, MD

## 2018-06-30 NOTE — Anesthesia Post-op Follow-up Note (Signed)
Anesthesia QCDR form completed.        

## 2018-06-30 NOTE — Interval H&P Note (Signed)
History and Physical Interval Note:  06/30/2018 7:02 AM  John Jenkins  has presented today for surgery, with the diagnosis of B  The various methods of treatment have been discussed with the patient and family. After consideration of risks, benefits and other options for treatment, the patient has consented to  Procedure(s): VIDEO ASSISTED THORACOSCOPY (VATS)/THOROCOTOMY WITH LUNG BIOPSY-LEFT  PRE-OP BRONCH (Left) THORACOTOMY MAJOR POSSIBLE (Left) as a surgical intervention .  The patient's history has been reviewed, patient examined, no change in status, stable for surgery.  I have reviewed the patient's chart and labs.  Questions were answered to the patient's satisfaction.     Nestor Lewandowsky

## 2018-06-30 NOTE — Anesthesia Procedure Notes (Signed)
Procedure Name: Intubation Performed by: Emmie Niemann, MD Pre-anesthesia Checklist: Patient identified, Emergency Drugs available, Suction available, Patient being monitored and Timeout performed Patient Re-evaluated:Patient Re-evaluated prior to induction Oxygen Delivery Method: Circle system utilized Preoxygenation: Pre-oxygenation with 100% oxygen Induction Type: IV induction Ventilation: Mask ventilation without difficulty Laryngoscope Size: Mac and 4 Grade View: Grade II Endobronchial tube: Left and 39 Fr Number of attempts: 1 Airway Equipment and Method: Stylet Placement Confirmation: ETT inserted through vocal cords under direct vision,  breath sounds checked- equal and bilateral and positive ETCO2

## 2018-06-30 NOTE — Transfer of Care (Signed)
Immediate Anesthesia Transfer of Care Note  Patient: John Jenkins  Procedure(s) Performed: Procedure(s): VIDEO ASSISTED THORACOSCOPY (VATS)/THOROCOTOMY WITH LUNG BIOPSY-LEFT  PRE-OP BRONCH (Left)  Patient Location: PACU  Anesthesia Type:General  Level of Consciousness: sedated  Airway & Oxygen Therapy: Patient Spontanous Breathing and Patient connected to face mask oxygen  Post-op Assessment: Report given to RN and Post -op Vital signs reviewed and stable  Post vital signs: Reviewed and stable  Last Vitals:  Vitals:   06/30/18 0930 06/30/18 0933  BP:  131/79  Pulse:  71  Resp:  17  Temp: 36.7 C   SpO2:  620%    Complications: No apparent anesthesia complications

## 2018-06-30 NOTE — Progress Notes (Signed)
Per infection prevention pt doesn't needs to be in contact precautions. He has already been tx for in,.

## 2018-06-30 NOTE — Anesthesia Postprocedure Evaluation (Signed)
Anesthesia Post Note  Patient: John Jenkins  Procedure(s) Performed: VIDEO ASSISTED THORACOSCOPY (VATS)/THOROCOTOMY WITH LUNG BIOPSY-LEFT  PRE-OP BRONCH (Left )  Patient location during evaluation: PACU Anesthesia Type: General Level of consciousness: awake and alert and oriented Pain management: pain level controlled Vital Signs Assessment: post-procedure vital signs reviewed and stable Respiratory status: spontaneous breathing, nonlabored ventilation and respiratory function stable Cardiovascular status: blood pressure returned to baseline and stable Postop Assessment: no signs of nausea or vomiting Anesthetic complications: no     Last Vitals:  Vitals:   06/30/18 1000 06/30/18 1003  BP: 134/75   Pulse: 69 69  Resp: 13 16  Temp:  (!) 36.2 C  SpO2: 97% 98%    Last Pain:  Vitals:   06/30/18 1000  TempSrc:   PainSc: 0-No pain                 Cyra Spader

## 2018-06-30 NOTE — Anesthesia Preprocedure Evaluation (Signed)
Anesthesia Evaluation  Patient identified by MRN, date of birth, ID band Patient awake    Reviewed: Allergy & Precautions, NPO status , Patient's Chart, lab work & pertinent test results  History of Anesthesia Complications Negative for: history of anesthetic complications  Airway Mallampati: II  TM Distance: >3 FB Neck ROM: Full    Dental no notable dental hx.    Pulmonary neg sleep apnea, neg COPD,    breath sounds clear to auscultation- rhonchi (-) wheezing      Cardiovascular Exercise Tolerance: Good (-) hypertension(-) CAD, (-) Past MI, (-) Cardiac Stents and (-) CABG  Rhythm:Regular Rate:Normal - Systolic murmurs and - Diastolic murmurs    Neuro/Psych neg Seizures negative neurological ROS  negative psych ROS   GI/Hepatic negative GI ROS, Neg liver ROS,   Endo/Other  negative endocrine ROSneg diabetes  Renal/GU negative Renal ROS     Musculoskeletal negative musculoskeletal ROS (+)   Abdominal (+) + obese,   Peds  Hematology negative hematology ROS (+)   Anesthesia Other Findings Past Medical History: 2019: DVT (deep venous thrombosis) (HCC) No date: Obese No date: Pneumonia No date: Seasonal allergies   Reproductive/Obstetrics                             Anesthesia Physical Anesthesia Plan  ASA: II  Anesthesia Plan: General   Post-op Pain Management:    Induction: Intravenous  PONV Risk Score and Plan: 1 and Ondansetron and Midazolam  Airway Management Planned: Oral ETT and Double Lumen EBT  Additional Equipment:   Intra-op Plan:   Post-operative Plan: Extubation in OR and Possible Post-op intubation/ventilation  Informed Consent: I have reviewed the patients History and Physical, chart, labs and discussed the procedure including the risks, benefits and alternatives for the proposed anesthesia with the patient or authorized representative who has indicated his/her  understanding and acceptance.     Dental advisory given  Plan Discussed with: CRNA and Anesthesiologist  Anesthesia Plan Comments: (Discussed possible need for arterial line)        Anesthesia Quick Evaluation

## 2018-07-01 ENCOUNTER — Inpatient Hospital Stay: Payer: Medicare HMO

## 2018-07-01 MED ORDER — ALBUTEROL SULFATE (2.5 MG/3ML) 0.083% IN NEBU
2.5000 mg | INHALATION_SOLUTION | Freq: Two times a day (BID) | RESPIRATORY_TRACT | Status: DC
  Administered 2018-07-01 – 2018-07-04 (×6): 2.5 mg via RESPIRATORY_TRACT
  Filled 2018-07-01 (×6): qty 3

## 2018-07-01 NOTE — Progress Notes (Addendum)
Per MD okay for RN to stop iv fluids, DC foley catheter and place chest tubes to water seal. Per Zack okay to place PT order. Per pt request.

## 2018-07-01 NOTE — Care Management (Addendum)
Patient s/p Left thoracoscopy with biopsy of left lower lobe.  Patient lives at home alone.  Patient states that at discharge his sister will be staying with him for at least 24 hours.  Then his adult son will be staying with him. PCP [facility].  At baseline patient is independent, however he will have driving restrictions at discharge.  Patient states that friends will be taking him to his follow up appointments. Pharmacy [facility] [address].  Denies issues obtaining medications.  PT has assessed patient and recommends home health PT and RW.  Patient agreeable to both.  Patient states that he has already been in touch with [place] with [facility] and would like to proceed with them.  CMS Medicare.gov Compare Post Acute Care list reviewed with patient.  Brad with [facility] notified of need for RW. Per [place] they already have protocol orders, and will not need additional home health orders at discharge.    RNCM notified that patient may want to consider SNF.  Patient notified that currently there is not a skilled need for SNF and would be an out of pocket expense.  Patient states that he does not want to go to SNF and wishes to return home

## 2018-07-01 NOTE — Progress Notes (Signed)
Patient ID: John Jenkins, male   DOB: 11/14/1950, 69 y.o.   MRN: [id number]  He had a pretty quiet night.  He states that his pain is under good control with the exception of one area anteriorly.  Perhaps this is related to his chest tube positioning.  He does not have an air leak today and I place his tubes to waterseal.  He still has his Foley in place and we will remove that as well.  The pathology is still pending  His lungs are equal bilaterally.  His heart is regular.  I did not remove his dressings but the dressings themselves are clean dry and intact.  I will repeat his chest x-Trace later today with his chest tube placed to waterseal drainage.

## 2018-07-01 NOTE — Evaluation (Signed)
Physical Therapy Evaluation Patient Details Name: John Jenkins MRN: [id number] DOB: 1950/09/06 Today's Date: 07/01/2018   History of Present Illness  Pt is 68 yo male s/p Left thoracoscopy with biopsy of left lower lobe 06/30/2018. PMH of DVT, obesity  Clinical Impression  Patient alert, in bed with RN at bedside administering pain medication prior to PT evaluation. Patient alert, oriented, behavior WFLs and able to answer all questions appropriately. Patient reported that he lives in a house alone, previously independent in ADLs/IADLs/gait, drives.   The patient reported significant L flank pain with all mobility this session. Grimacing, moaning, and guarding noted. Patient with extended time, use of bed rails, and verbal cues was able to perform supine to sit with supervision. Patient reached for additional external support to attempt sit to stand, PT provided a RW to assist and to improve independence, as well as verbal cues about technique. Patient able to perform transfer with CGA and RW. Pt ambulated ~179ft with CGA and RW, deferred further distance due to pain, spO2 >90% throughout. PT and patient spent extensive time on education/expectations of discharge planning, safety during mobility, and pt anxiety about discharge status.  Overall the patient demonstrated deficits (see "PT Problem List") that impede the patient's functional abilities, safety, and mobility and would benefit from skilled PT intervention. Recommendation is HHPT.      Follow Up Recommendations Home health PT    Equipment Recommendations  Rolling walker with 5" wheels(TALL bariatric RW)    Recommendations for Other Services       Precautions / Restrictions Precautions Precaution Comments: chest tube      Mobility  Bed Mobility Overal bed mobility: Needs Assistance Bed Mobility: Supine to Sit     Supine to sit: Supervision     General bed mobility comments: verbal cues for sequencing of  movements  Transfers Overall transfer level: Modified independent Equipment used: Rolling walker (2 wheeled)             General transfer comment: increased time, verbal cues for use of AD, use of bed rails  Ambulation/Gait Ambulation/Gait assistance: Min guard;Supervision Gait Distance (Feet): 100 Feet Assistive device: Rolling walker (2 wheeled)   Gait velocity: decreased   General Gait Details: Pt complained of pain with ambulation, but overall safe, steady, good use of RW.  Stairs            Wheelchair Mobility    Modified Rankin (Stroke Patients Only)       Balance Overall balance assessment: Mild deficits observed, not formally tested                                           Pertinent Vitals/Pain Pain Assessment: 0-10 Pain Location: L side, with L arm movement Pain Descriptors / Indicators: Grimacing;Guarding;Moaning Pain Intervention(s): Limited activity within patient's tolerance;Monitored during session;Premedicated before session;Repositioned    Home Living Family/patient expects to be discharged to:: Private residence Living Arrangements: Alone Available Help at Discharge: Family;Available PRN/intermittently Type of Home: House Home Access: Stairs to enter Entrance Stairs-Rails: Psychiatric nurse of Steps: 5 Home Layout: One level Home Equipment: Shower seat Additional Comments: Pt has AD from his mother.    Prior Function Level of Independence: Independent         Comments: no falls in the last 6 months     Hand Dominance   Dominant Hand: Right  Extremity/Trunk Assessment   Upper Extremity Assessment Upper Extremity Assessment: Overall WFL for tasks assessed    Lower Extremity Assessment Lower Extremity Assessment: Generalized weakness       Communication   Communication: No difficulties  Cognition Arousal/Alertness: Awake/alert Behavior During Therapy: WFL for tasks  assessed/performed Overall Cognitive Status: Within Functional Limits for tasks assessed                                        General Comments      Exercises     Assessment/Plan    PT Assessment Patient needs continued PT services  PT Problem List Decreased strength;Cardiopulmonary status limiting activity;Decreased activity tolerance;Decreased safety awareness;Decreased balance;Decreased mobility;Decreased knowledge of precautions       PT Treatment Interventions DME instruction;Therapeutic exercise;Gait training;Balance training;Stair training;Neuromuscular re-education;Functional mobility training;Therapeutic activities;Patient/family education    PT Goals (Current goals can be found in the Care Plan section)  Acute Rehab PT Goals Patient Stated Goal: to return to PLOF safely PT Goal Formulation: With patient Time For Goal Achievement: 07/15/18 Potential to Achieve Goals: Good    Frequency Min 2X/week   Barriers to discharge        Co-evaluation               AM-PAC PT "6 Clicks" Mobility  Outcome Measure Help needed turning from your back to your side while in a flat bed without using bedrails?: A Little Help needed moving from lying on your back to sitting on the side of a flat bed without using bedrails?: A Little Help needed moving to and from a bed to a chair (including a wheelchair)?: A Little Help needed standing up from a chair using your arms (e.g., wheelchair or bedside chair)?: A Little Help needed to walk in hospital room?: A Little Help needed climbing 3-5 steps with a railing? : A Little 6 Click Score: 18    End of Session Equipment Utilized During Treatment: Gait belt Activity Tolerance: Patient tolerated treatment well Patient left: in chair;with chair alarm set;with SCD's reapplied;with call bell/phone within reach Nurse Communication: Mobility status PT Visit Diagnosis: Difficulty in walking, not elsewhere classified  (R26.2);Muscle weakness (generalized) (M62.81);Other abnormalities of gait and mobility (R26.89)    Time: 5056-9794 PT Time Calculation (min) (ACUTE ONLY): 50 min   Charges:   PT Evaluation $PT Eval Low Complexity: 1 Low PT Treatments $Therapeutic Exercise: 8-22 mins $Therapeutic Activity: 8-22 mins        Lieutenant Diego PT, DPT 2:50 PM,07/01/18 [phone]

## 2018-07-02 ENCOUNTER — Inpatient Hospital Stay: Payer: Medicare HMO

## 2018-07-02 DIAGNOSIS — I499 Cardiac arrhythmia, unspecified: Secondary | ICD-10-CM

## 2018-07-02 DIAGNOSIS — R918 Other nonspecific abnormal finding of lung field: Secondary | ICD-10-CM

## 2018-07-02 DIAGNOSIS — Z8701 Personal history of pneumonia (recurrent): Secondary | ICD-10-CM

## 2018-07-02 DIAGNOSIS — B450 Pulmonary cryptococcosis: Principal | ICD-10-CM

## 2018-07-02 DIAGNOSIS — Z7901 Long term (current) use of anticoagulants: Secondary | ICD-10-CM

## 2018-07-02 DIAGNOSIS — Z95820 Peripheral vascular angioplasty status with implants and grafts: Secondary | ICD-10-CM

## 2018-07-02 DIAGNOSIS — Z978 Presence of other specified devices: Secondary | ICD-10-CM

## 2018-07-02 DIAGNOSIS — Z86718 Personal history of other venous thrombosis and embolism: Secondary | ICD-10-CM

## 2018-07-02 DIAGNOSIS — I4891 Unspecified atrial fibrillation: Secondary | ICD-10-CM

## 2018-07-02 LAB — COMPREHENSIVE METABOLIC PANEL
ALT: 22 U/L (ref 0–44)
AST: 19 U/L (ref 15–41)
Albumin: 3.4 g/dL — ABNORMAL LOW (ref 3.5–5.0)
Alkaline Phosphatase: 54 U/L (ref 38–126)
Anion gap: 6 (ref 5–15)
BUN: 12 mg/dL (ref 8–23)
CO2: 25 mmol/L (ref 22–32)
Calcium: 8.9 mg/dL (ref 8.9–10.3)
Chloride: 102 mmol/L (ref 98–111)
Creatinine, Ser: 0.72 mg/dL (ref 0.61–1.24)
GFR calc Af Amer: >60 mL/min (ref >60)
GFR calc non Af Amer: >60 mL/min (ref >60)
Glucose, Bld: 130 mg/dL — ABNORMAL HIGH (ref 70–99)
Potassium: 3.8 mmol/L (ref 3.5–5.1)
SODIUM: 133 mmol/L — AB (ref 135–145)
Total Bilirubin: 1.3 mg/dL — ABNORMAL HIGH (ref 0.3–1.2)
Total Protein: 7.1 g/dL (ref 6.5–8.1)

## 2018-07-02 LAB — CBC WITH DIFFERENTIAL/PLATELET
Abs Immature Granulocytes: 0.04 10*3/uL (ref 0.00–0.07)
Basophils Absolute: 0 10*3/uL (ref 0.0–0.1)
Basophils Relative: 0 %
Eosinophils Absolute: 0.2 10*3/uL (ref 0.0–0.5)
Eosinophils Relative: 2 %
HCT: 45.4 % (ref 39.0–52.0)
Hemoglobin: 15.4 g/dL (ref 13.0–17.0)
IMMATURE GRANULOCYTES: 0 %
Lymphocytes Relative: 22 %
Lymphs Abs: 2.4 10*3/uL (ref 0.7–4.0)
MCH: 31.8 pg (ref 26.0–34.0)
MCHC: 33.9 g/dL (ref 30.0–36.0)
MCV: 93.8 fL (ref 80.0–100.0)
Monocytes Absolute: 1.1 10*3/uL — ABNORMAL HIGH (ref 0.1–1.0)
Monocytes Relative: 10 %
Neutro Abs: 7.5 10*3/uL (ref 1.7–7.7)
Neutrophils Relative %: 66 %
Platelets: 187 10*3/uL (ref 150–400)
RBC: 4.84 MIL/uL (ref 4.22–5.81)
RDW: 13.2 % (ref 11.5–15.5)
WBC: 11.2 10*3/uL — ABNORMAL HIGH (ref 4.0–10.5)
nRBC: 0 % (ref 0.0–0.2)

## 2018-07-02 LAB — SURGICAL PATHOLOGY

## 2018-07-02 MED ORDER — DILTIAZEM HCL 25 MG/5ML IV SOLN
25.0000 mg | Freq: Once | INTRAVENOUS | Status: AC
  Administered 2018-07-02: 25 mg via INTRAVENOUS
  Filled 2018-07-02: qty 5

## 2018-07-02 MED ORDER — GUAIFENESIN-DM 100-10 MG/5ML PO SYRP
5.0000 mL | ORAL_SOLUTION | ORAL | Status: DC | PRN
  Administered 2018-07-02 – 2018-07-04 (×2): 5 mL via ORAL
  Filled 2018-07-02 (×3): qty 5

## 2018-07-02 MED ORDER — HEPARIN SODIUM (PORCINE) 5000 UNIT/ML IJ SOLN
5000.0000 [IU] | Freq: Three times a day (TID) | INTRAMUSCULAR | Status: DC
  Administered 2018-07-02 – 2018-07-03 (×2): 5000 [IU] via SUBCUTANEOUS
  Filled 2018-07-02 (×2): qty 1

## 2018-07-02 MED ORDER — FLUCONAZOLE IN SODIUM CHLORIDE 400-0.9 MG/200ML-% IV SOLN
800.0000 mg | INTRAVENOUS | Status: DC
  Administered 2018-07-02 – 2018-07-03 (×2): 800 mg via INTRAVENOUS
  Filled 2018-07-02 (×2): qty 400

## 2018-07-02 MED ORDER — METOPROLOL TARTRATE 50 MG PO TABS
50.0000 mg | ORAL_TABLET | Freq: Two times a day (BID) | ORAL | Status: DC
  Administered 2018-07-02 – 2018-07-04 (×4): 50 mg via ORAL
  Filled 2018-07-02 (×5): qty 1

## 2018-07-02 MED ORDER — ACETAMINOPHEN 325 MG PO TABS
650.0000 mg | ORAL_TABLET | Freq: Four times a day (QID) | ORAL | Status: DC | PRN
  Administered 2018-07-02: 650 mg via ORAL
  Filled 2018-07-02: qty 2

## 2018-07-02 MED ORDER — DILTIAZEM HCL ER COATED BEADS 120 MG PO CP24
240.0000 mg | ORAL_CAPSULE | Freq: Once | ORAL | Status: AC
  Administered 2018-07-02: 240 mg via ORAL
  Filled 2018-07-02: qty 2

## 2018-07-02 NOTE — Progress Notes (Signed)
Per CCMD patient in Afib, HR running 140s to 160s. Patient currently off the floor in xray. Per xray patient is visibly asymptomatic. Alerted Dr. Genevive Bi of patients current status.   Fuller Mandril, RN

## 2018-07-02 NOTE — Care Management Important Message (Signed)
Copy of signed Medicare IM left with patient in room. 

## 2018-07-02 NOTE — Progress Notes (Addendum)
Pt's heart rhythm still showing Afib, per CCMD patient ranging from the 130s to 150s. Pts BP 123/69 T 97.6 P 125 SpO2 93% on RA. Pt states he has felt some palpitations. Pt still denies any pain or chest discomfort. Paged Cardiologist with reassessment.   Fuller Mandril, RN

## 2018-07-02 NOTE — Consult Note (Signed)
CC crypto on lung bx  Hpi: This is a pleasant 68 year old male, had lung bx per Dr Genevive Bi. Tolerated procedure well. Chest tube to be taken out, Developed rapid afib, cardiology following, cardiazem given. He is off his Eliquis. Support stocking and pneumatics stocking on going. Path called today and notified us that there is the heavy presence of crypto found.    Presently in bed without hemoptysis, fever, increasing sob or cough. No stiff neck or headaches ID is aware. Reviewed slides with her. He has had short burst of steroids in the past.    Ros; no syncope, chest pain, leg pain, swelling, bleeding, rashes, nodes, recent travel, birds or stiff neck. No eye changes or headaches. No nausea or abdominal pain, no new gu sxs.   See last pulmonary note  Afebrile, no apparent distress, good color Heent: No thrush, eye changes Neck supple Chest equal bilateral sounds Cor. Irreq, no murmurs no periph signs of SBE Abd; Soft, non obese Ext. No calf pain, no significant edema Neuro: No focal deficits   PYSCH; + Good insight  Impression; 1. Pulmonary crypto  ID consult Crypto serum ag Start treatment Await on final path Ok to take chest tube out  AFIB, rapid ventricular rate Following cardiology recs If Lp not planned, restart eliquis

## 2018-07-02 NOTE — Significant Event (Signed)
Rapid Response Event Note  Overview: Time Called: 1011 Arrival Time: 1012 Event Type: Cardiac  Initial Focused Assessment: Rapid response RN arrived in patient's room and patient was alert and oriented sitting up in the bed. Per patient and patient's RN Misty, who was at bedside, patient went into new onset atrial fibrillation while in radiology department getting a chest x-Erick. Misty RN had notified patient's primary MD, Dr. Genevive Bi, who spoke to Dr. Nehemiah Massed from cardiology about patient's condition. Dr. Nehemiah Massed initially ordered just 240 mg PO extended release cardizem. This RN assessed patient and recalled Dr. Nehemiah Massed to see if he wanted to add IV dose as well. He ordered 25 mg IV cardizem one time push in addition to the PO extended release. Initial vital signs at 10:23 HR afib, rate 150s-130s (mostly in 140s and 150s), BP 136/84, MAP 98, oxygen saturations 97% on room air. No shortness of breath, no dizziness, no pain, no nausea or vomiting. Patient did report his mother had atrial fibrillation and that she had to have several medication changes and interventions (though did not specify what these were) over her life.  Interventions: This RN administered 25 mg of IV cardizem slow push over about 25 minutes. Heart rate improved after administration of cardizem IV to fluctuate between upper 90s and 120s with very short occasional runs into the 140s, still afib. BPs were as follows: 10:23 136/84 MAP 98 10:36 135/77, MAP 95 10:41 119/84 MAP 91 10:46 110/73 MAP 86 10:53 112/74 MAP 86 10:56 136/97 MAP 110  Plan of Care (if not transferred): Patient to stay on 2C for now. Patient will keep cardiac monitoring on. Around 12:00 (around 1 hour post PO cardizem) Misty RN will reassess patient and recall rapid response or Dr. Nehemiah Massed as needed. Patient and Misty RN will also recall rapid response if patient if patient destabilizes.  Event Summary: Name of Physician Notified: Dr. Genevive Bi at (prior to rapid  response call)  Name of Consulting Physician Notified: Dr. Nehemiah Massed at 1015(had already been paged and called back but repaged for IV medication)  Outcome: Stayed in room and stabalized  Event End Time: [address] [place]

## 2018-07-02 NOTE — Progress Notes (Signed)
Called report to Janett Billow RN taking patient transfer to room 252.   Fuller Mandril, RN

## 2018-07-02 NOTE — Progress Notes (Signed)
Patient ID: John Jenkins, male   DOB: 04/26/1951, 68 y.o.   MRN: [id number]   He had some discomfort in the anteriormost incision yesterday when he was up and walking.  Otherwise he seems to be getting along pretty well.  There is no air leak from the chest tube.  There has been only minimal drainage.  His chest x-[name] yesterday was independently reviewed and looks okay.  I see no pleural effusion or pneumothorax.  His lungs are equal.  His heart is regular.  His thoracoscopy sites are healing as expected.  The Steri-Strips are covering the wound.  There is no erythema or drainage.  We will repeat his chest x-[name] this morning.  If that looks okay we will remove his chest tube.  We are awaiting consultation from our pulmonary medicine colleagues.

## 2018-07-02 NOTE — Progress Notes (Signed)
Per Dr. Nehemiah Massed, give one time dose IVP Cardizem 25 mg and Cardizem CD oral 240 mg.   Fuller Mandril, RN

## 2018-07-02 NOTE — Progress Notes (Signed)
Per Dr. Genevive Bi - order subQ heparin.

## 2018-07-02 NOTE — Consult Note (Signed)
NAME: John Jenkins  DOB: Apr 28, 1951  MRN: [id number]  Date/Time: 07/02/2018 11:40 AM  REQUESTING PROVIDER Dr. Faith Rogue Subjective:  REASON FOR CONSULT: Cryptococcus in the lung biopsy ? John Jenkins is a 68 y.o. male with a history of recent pneumonia, recent left DVT status post thrombectomy and placement of stent and IVC filter in December 2019 was admitted to the hospital for an elective thoracoscopy  lung biopsy for possible malignancy which he underwent on 06/30/2018.  The pathology showing cryptococcus and I am asked to see the patient.  Patient was in his usual state of health until October 2019.  Between July and October 2019 he went on a Mediterranean diet and intentionally lost 40 pounds.  This made him feel a little weak and tired at  end of October and beginning of November.  He initially went to urgent care and was prescribed albuterol and Levaquin and as it did not improve he came to [facility] clinic on April 03, 2018 for cough, congestion, sinus pressure and fatigue for the past 10 days.    He was diagnosed with possible sinusitis and was given azithromycin for 5 days..  On 04/10/2018 he went back to his PCP with cough and body aches.  He was also complaining of right-sided chest wall pain on coughing.  A chest x-Rondrick was ordered and he was placed on Omnicef.  The chest x-Reilley showed possible atelectasis versus developing pneumonia.  He return to see his PCP on 04/17/2018 another x-Aveon was done and he was put on prednisone 40 mg for 10 days and Levaquin 500 mg for 5 days.  He then saw pulmonologist at the community clinic on 04/19/2018 and he was improving the plan was to wait for a few more weeks for the radiographic improvement to catch up to clinical picture.  . On 05/03/2018 he presented to his PCP with left leg lower leg swelling and was sent to the hospital for admission and he was diagnosed with left DVT.  He underwent on 05/04/2018 mechanical thrombectomy of the left popliteal, SFV and common  femoral veins and the left external iliac vein and common iliac vein using the penumbra catheter.  He also had a stent placement in the left common iliac vein.  He also had angioplasty of the left external iliac vein and left common iliac vein and left common femoral and superficial femoral veins.  Also an IVC filter was placed.  He was discharged home on 05/06/2018 on Eliquis.  He saw Dr.Aleskerov on 05/20/2018 and a CT chest was ordered to rule out any malignancy as there was a concern that the recent DVT could have come from coagulopathy induced by neoplasm.  CT scan done on 05/26/2018 showed poorly marginated nodular and perilobular bandlike foci of consolidation in both lungs with surrounding mild groundglass opacity.  No thoracic adenopathy was noted.  There was a posterior left thyroid lobe of 2 cm noted. This was followed by PET scan done on 06/09/2018 from skull base to thigh and that showed linear band in the left lower lobe along the pleural surface with intense metabolic activity.  Mild metabolic activity associated with subpleural bandlike thickening in the left upper lobe.  No evidence of metastatic adenopathy and no evidence of distant metastatic disease.   He was then referred to thoracic surgeon for biopsy and saw Dr. Faith Rogue on 06/11/2018. He is now admitted to the hospital and underwent left thoracoscopy with biopsy of the left lower lobe.  I am asked  to see the patient as the pathology showing cryptococcus in the lung.  On questioning patient he does not give any specific risk for cryptococcus like being exposed to bird guano or having pigeons or exposed to pigeons or rotting vegetation.  He does have a large property and uses a lawnmower.  He has not done any manual soil work or excavation or digging or being in vegetation and soil. According to him he is not immunocompromised like being on long-term steroids, autoimmune disorder or malignancy or chemotherapy.  No outdoor activities like camping or  fishing or hunting or swimming. He does not have diabetes.  He does not drink alcohol. He lives on his own.  Non-smoker does not do any illicit drugs. Was tested for HIV in December and was nonreactive. Has not had any travel recently. He had an intentional weight loss of 40 pounds between July and October and gained about 10 pounds back. Does not have any fever or headache or night sweats. He has some shortness of breath on exertion but is much better than before.  Since January 2020 he has been feeling better.  Today in the hospital he was noted to have atrial fibrillation and was given diltiazem.  He does not have any prior cardiac problems.  Past Medical History:  Diagnosis Date  . DVT (deep venous thrombosis) ([place]) 2019  . Obese   . Pneumonia   . Seasonal allergies     Past Surgical History:  Procedure Laterality Date  . ANKLE SURGERY  1974  . broken arm  1970  . PERIPHERAL VASCULAR THROMBECTOMY Left 05/04/2018   Procedure: PERIPHERAL VASCULAR THROMBECTOMY;  Surgeon: Katha Cabal, MD;  Location: [facility];  Service: Cardiovascular;  Laterality: Left;  Marland Kitchen VIDEO ASSISTED THORACOSCOPY (VATS)/THOROCOTOMY Left 06/30/2018   Procedure: VIDEO ASSISTED THORACOSCOPY (VATS)/THOROCOTOMY WITH LUNG BIOPSY-LEFT  PRE-OP BRONCH;  Surgeon: Nestor Lewandowsky, MD;  Location: [facility];  Service: General;  Laterality: Left;    Social history Retired Used to work with IT and also in the telephone Non-smoker No alcohol No illicit drug use Has 2 sons Divorced Family History  Problem Relation Age of Onset  . Heart disease Mother   . Heart attack Father    No Known Allergies  ? Current Facility-Administered Medications  Medication Dose Route Frequency Provider Last Rate Last Dose  . albuterol (PROVENTIL) (2.5 MG/3ML) 0.083% nebulizer solution 2.5 mg  2.5 mg Nebulization BID Nestor Lewandowsky, MD   2.5 mg at 07/02/18 0845  . bisacodyl (DULCOLAX) EC tablet 10 mg  10 mg Oral Daily Nestor Lewandowsky, MD   10 mg at 07/01/18 0848  . oxyCODONE-acetaminophen (PERCOCET) 7.5-325 MG per tablet 1-2 tablet  1-2 tablet Oral Q4H PRN Nestor Lewandowsky, MD   2 tablet at 07/02/18 0244  . senna-docusate (Senokot-S) tablet 1 tablet  1 tablet Oral Daily PRN Nestor Lewandowsky, MD   1 tablet at 07/02/18 0416  . traMADol (ULTRAM) tablet 50-100 mg  50-100 mg Oral Q6H Nestor Lewandowsky, MD   50 mg at 07/02/18 7425     Abtx:  Anti-infectives (From admission, onward)   Start     Dose/Rate Route Frequency Ordered Stop   06/30/18 0640  vancomycin (VANCOCIN) 1-5 GM/200ML-% IVPB    Note to Pharmacy:  Yevette Edwards   : cabinet override      06/30/18 0640 06/30/18 1859   06/30/18 0632  ceFAZolin (ANCEF) 2-4 GM/100ML-% IVPB    Note to Pharmacy:  Yevette Edwards   :  cabinet override      06/30/18 0632 06/30/18 1859   06/30/18 0600  vancomycin (VANCOCIN) IVPB 1000 mg/200 mL premix     1,000 mg 200 mL/hr over 60 Minutes Intravenous On call to O.R. 06/29/18 2205 06/30/18 1041   06/30/18 0600  ceFAZolin (ANCEF) IVPB 2g/100 mL premix  Status:  Discontinued     2 g 200 mL/hr over 30 Minutes Intravenous On call to O.R. 06/29/18 2205 06/30/18 1017      REVIEW OF SYSTEMS:  Const: negative fever, negative chills, positive weight loss Eyes: negative diplopia or visual changes, negative eye pain ENT: negative coryza, negative sore throat Resp: negative cough, hemoptysis, some dyspnea Cards: negative for chest pain, palpitations, had left lower extremity edema GU: negative for frequency, dysuria and hematuria GI: Negative for abdominal pain, diarrhea, bleeding, constipation Skin: negative for rash and pruritus Heme: negative for easy bruising and gum/nose bleeding MS: negative for myalgias, arthralgias, back pain and muscle weakness Neurolo:negative for headaches, dizziness, vertigo, memory problems  Psych: negative for feelings of anxiety, depression  Endocrine: No polyuria or polydipsia Allergy/Immunology- negative  for any medication or food allergies ? Objective:  VITALS:  BP (!) 153/88 (BP Location: Left Arm)   Pulse (!) 170   Temp 97.7 F (36.5 C) (Oral)   Resp (!) 24   Ht 6\' 4"  (1.93 m)   Wt (!) 146.1 kg   SpO2 98%   BMI 39.20 kg/m  PHYSICAL EXAM:  General: Alert, cooperative, no distress, appears stated age.  Obese Head: Normocephalic, without obvious abnormality, atraumatic. Eyes: Conjunctivae clear, anicteric sclerae. Pupils are equal ENT Nares normal. No drainage or sinus tenderness. Lips, mucosa, and tongue normal. No Thrush Neck: Supple, symmetrical, no adenopathy, thyroid: non tender no carotid bruit and no JVD. Back: No CVA tenderness. Lungs: Bilateral air entry.  Decreased on the left side.  3 surgical  areas noted on the left side.  Thoracostomy tube on the left side Heart: Irregular   abdomen: Soft, non-tender,not distended. Bowel sounds normal. No masses Extremities: atraumatic, no cyanosis. No edema. No clubbing Skin: Sun exposure changes on the shins left more than right Lymph: Cervical, supraclavicular normal. Neurologic: Grossly non-focal Pertinent Labs Lab Results CBC    Component Value Date/Time   WBC 7.4 06/22/2018 1016   RBC 4.75 06/22/2018 1016   HGB 14.9 06/22/2018 1016   HCT 44.7 06/22/2018 1016   PLT 202 06/22/2018 1016   MCV 94.1 06/22/2018 1016   MCH 31.4 06/22/2018 1016   MCHC 33.3 06/22/2018 1016   RDW 13.1 06/22/2018 1016    CMP Latest Ref Rng & Units 06/22/2018 05/05/2018 05/03/2018  Glucose 70 - 99 mg/dL 91 97 95  BUN 8 - 23 mg/dL 15 9 10   Creatinine 0.61 - 1.24 mg/dL 0.70 0.73 0.74  Sodium 135 - 145 mmol/L 138 132(L) 133(L)  Potassium 3.5 - 5.1 mmol/L 3.9 3.6 3.8  Chloride 98 - 111 mmol/L 106 102 101  CO2 22 - 32 mmol/L 26 24 24   Calcium 8.9 - 10.3 mg/dL 9.1 7.9(L) 8.7(L)  Total Protein 6.5 - 8.1 g/dL 7.0 6.0(L) 6.9  Total Bilirubin 0.3 - 1.2 mg/dL 0.9 0.8 1.0  Alkaline Phos 38 - 126 U/L 59 69 81  AST 15 - 41 U/L 27 33 47(H)  ALT 0 -  44 U/L 29 63(H) 102(H)   Pathology is reviewed with pathologist Shows granulomatous inflammation with yeast forms identified on GMS stain consistent with cryptococcus neoformans  organisms AFB stain is negative  See attached picture     Microbiology: Recent Results (from the past 240 hour(s))  Fungus Culture With Stain     Status: None (Preliminary result)   Collection Time: 06/30/18  8:00 AM  Result Value Ref Range Status   Fungus Stain Final report  Final    Comment: (NOTE) Performed At: [facility] [address] [place], [place] [id number] Rush Farmer MD [id number]    Fungus (Mycology) Culture PENDING  Incomplete   Fungal Source BIOPSY  Final    Comment: Performed at [facility], [address] [place], [place] [address]  Aerobic/Anaerobic Culture (surgical/deep wound)     Status: None (Preliminary result)   Collection Time: 06/30/18  8:00 AM  Result Value Ref Range Status   Specimen Description   Final    TISSUE LEFT LOWER LUNG Performed at [facility], [address] [place], [place] [address]    Special Requests   Final    NONE Performed at [facility], [address] [place], [place] [address]    Gram Stain   Final    MODERATE WBC PRESENT, PREDOMINANTLY MONONUCLEAR NO ORGANISMS SEEN    Culture   Final    NO GROWTH 2 DAYS NO ANAEROBES ISOLATED; CULTURE IN PROGRESS FOR 5 DAYS Performed at [facility] [address] [place], [place] [address]    Report Status PENDING  Incomplete  Acid Fast Smear (AFB)     Status: None   Collection Time: 06/30/18  8:00 AM  Result Value Ref Range Status   AFB Specimen Processing Comment  Final    Comment: Tissue Grinding and Digestion/Decontamination   Acid Fast Smear Negative  Final    Comment: (NOTE) Performed At: [facility] [place], [place] [id number] Rush Farmer MD ID:[id number]    Source (AFB) BIOPSY  Final    Comment: Performed at  [facility], [address] [place],  [address]  Fungus Culture Result     Status: None   Collection Time: 06/30/18  8:00 AM  Result Value Ref Range Status   Result 1 Comment  Final    Comment: (NOTE) KOH/Calcofluor preparation:  no fungus observed. Performed At: [facility] [place], [place] [id number] Rush Farmer MD JK:[id number]     IMAGING RESULTS:    I have personally reviewed the films.   ? Impression/Recommendation ?68 year old male who developed lower respiratory symptoms in October November and has been treated with antibiotics and prednisone short course with persistent changes in the lungs and underwent biopsy of the left lower lobe ? ?Multiple nodular infiltrate of the lung.  Predominantly on the left side now.  Biopsy done and pathology shows cryptococcus  Pulmonary cryptococcosis.  In a patient who does not seemingly have any immune compromised conditions like malignancy, chemotherapy or prolonged steroid use or HIV or autoimmune conditions.  Recent DVT left leg raises concern for an undiagnosed malignancy but the PET scan done in January did not show any activities other than the lung.  Clinically there is no evidence of dissemination including CNS or skin or bone involvement.  Will not do a lumbar puncture at this time. We will get a cryptococcal antigen titer. We will also check CD4 count and baseline labs. We will start fluconazole 6 mg/kg body weight .  If the cryptococcal antigen titer comes positive and a high titer >1:512 then will do lumbar puncture and start IV amphotericin . Watch for  interaction  between fluconazole and Eliquis.  New onset A. fib.  On metoprolol.  Received 2 doses of diltiazem   Recent DVT left leg December 2019 status post thrombectomy, stent placement and IVC filter  ID will follow him peripherally this weekend.  ___________________________________________________ Discussed with patient  and her sister and his son Note:  This document was prepared using Systems analyst and may include unintentional dictation errors.

## 2018-07-02 NOTE — Plan of Care (Addendum)
Pt experiencing acute Afib RVR, HR ranging 130s to 170s. Pt given IV and oral Cardizem per Cardiologist consulted. Pt not showing signs of SOB or experiencing chest discomfort, however feeling some palpitations. MD transferring pt to telemetry floor.   Fuller Mandril, RN

## 2018-07-02 NOTE — Progress Notes (Signed)
Pt was having an X-Nikodem but coded due to Afib. Ch arrived w/ RRT to be w/ Mr. Barg who shared he was not in significant pain but noticed the rapid heartbeat. Team that was present monitored his heart rate and BP closely while administering meds. Pt was calm and was responsive. Ch remained until pt was stable. Ch spoke briefly w/ family members present to discuss the post-care of pt upon d/c. Pt has existential circumstances that may affect the healing process. Pt and family are hoping to be placed in rehab and transition home w/ H-H in place. Pt clergy was contacted but did not have direct contact.    07/02/18 1100  Clinical Encounter Type  Visited With Patient and family together;Health care provider  Visit Type Code;Psychological support;Social support;Spiritual support  Referral From Nurse  Consult/Referral To Chaplain  Spiritual Encounters  Spiritual Needs Emotional;Grief support  Stress Factors  Patient Stress Factors Exhausted;Family relationships;Health changes;Lack of caregivers;Loss;Loss of control;Major life changes  Family Stress Factors Family relationships;Lack of caregivers;Major life changes  Advance Directives (For Healthcare)  Does Patient Have a Medical Advance Directive?  (son is H-POA )  Does patient want to make changes to medical advance directive? No - Patient declined  Type of Paramedic of [place];Living will  Copy of [place] in Chart? No - copy requested  Copy of Living Will in Chart? No - copy requested

## 2018-07-02 NOTE — Consult Note (Signed)
[facility] Consultation Note  Patient ID: John Jenkins, MRN: [id number], DOB/AGE: 66/02/1951 66 y.o. Admit date: 06/30/2018   Date of Consult: 07/02/2018 Primary Physician: Dion Body, MD Primary Cardiologist: None  Chief Complaint: No chief complaint on file.  Reason for Consult: Atrial fibrillation with rapid ventricular rate  HPI: 68 y.o. male with acute axillary discomfort with some shortness of breath and abnormal chest x-March findings who has had recent lung biopsy with possible infection and/or pneumonia.  In recent months that the patient also has had episode of deep venous thrombosis in the left leg with pulmonary emboli and IVC filter with anticoagulation.  With his lung biopsy the patient ended up having episode of atrial fibrillation with rapid ventricular rate anywhere from 130 bpm to 160 bpm.  He has had some improvements with this heart rate control with intravenous diltiazem and slight improvements with oral diltiazem.  He is continuing to have heart rate of 130 bpm but asymptomatic without evidence of myocardial infarction or congestive heart failure type symptoms.  Therefore the patient will benefit from further heart rate control and anticoagulation for further risk reduction in stroke with atrial fibrillation.  Past Medical History:  Diagnosis Date  . DVT (deep venous thrombosis) ([place]) 2019  . Obese   . Pneumonia   . Seasonal allergies       Surgical History:  Past Surgical History:  Procedure Laterality Date  . ANKLE SURGERY  1974  . broken arm  1970  . PERIPHERAL VASCULAR THROMBECTOMY Left 05/04/2018   Procedure: PERIPHERAL VASCULAR THROMBECTOMY;  Surgeon: Katha Cabal, MD;  Location: [facility];  Service: Cardiovascular;  Laterality: Left;  Marland Kitchen VIDEO ASSISTED THORACOSCOPY (VATS)/THOROCOTOMY Left 06/30/2018   Procedure: VIDEO ASSISTED THORACOSCOPY (VATS)/THOROCOTOMY WITH LUNG BIOPSY-LEFT  PRE-OP BRONCH;  Surgeon: Nestor Lewandowsky, MD;   Location: [facility];  Service: General;  Laterality: Left;     Home Meds: Prior to Admission medications   Medication Sig Start Date End Date Taking? Authorizing Provider  apixaban (ELIQUIS) 5 MG TABS tablet Take 10 mg PO BID X 6 days and then start 5 mg PO BID Patient taking differently: Take 5 mg by mouth 2 (two) times daily.  05/06/18  Yes Sainani, Belia Heman, MD  Calcium-Magnesium-Zinc (CAL-MAG-ZINC PO) Take 1 tablet by mouth daily.   Yes [provider]  COLLAGEN-VITAMIN C PO Take 1 tablet by mouth daily.   Yes [provider]  fluticasone (FLONASE) 50 MCG/ACT nasal spray Place 2 sprays into the nose daily.   Yes [provider]  loratadine (CLARITIN) 10 MG tablet Take 10 mg by mouth at bedtime.   Yes [provider]  Multiple Vitamin (MULTIVITAMIN WITH MINERALS) TABS tablet Take 1 tablet by mouth daily. Centrum Silver   Yes [provider]  Omega-3 Fatty Acids (FISH OIL PO) Take 1 capsule by mouth daily.    Yes [provider]  potassium gluconate 595 (99 K) MG TABS tablet Take 595 mg by mouth daily.   Yes [provider]  senna-docusate (SENOKOT-S) 8.6-50 MG tablet Take 1 tablet by mouth daily as needed for mild constipation.   Yes [provider]  vitamin B-12 (CYANOCOBALAMIN) 1000 MCG tablet Take 1,000 mcg by mouth daily.   Yes [provider]  vitamin C (ASCORBIC ACID) 500 MG tablet Take 500 mg by mouth daily.   Yes [provider]    Inpatient Medications:  . albuterol  2.5 mg Nebulization BID  . bisacodyl  10 mg Oral Daily  . metoprolol tartrate  50 mg Oral BID  . traMADol  50-100 mg Oral Q6H     Allergies: No Known Allergies  Social History   Socioeconomic History  . Marital status: Single    Spouse name: Not on file  . Number of children: Not on file  . Years of education: Not on file  . Highest education level: Not on file  Occupational History  . Not on file  Social Needs  .  Financial resource strain: Not on file  . Food insecurity:    Worry: Not on file    Inability: Not on file  . Transportation needs:    Medical: Not on file    Non-medical: Not on file  Tobacco Use  . Smoking status: Never Smoker  . Smokeless tobacco: Never Used  Substance and Sexual Activity  . Alcohol use: Never    Frequency: Never  . Drug use: Never  . Sexual activity: Not on file  Lifestyle  . Physical activity:    Days per week: Not on file    Minutes per session: Not on file  . Stress: Not on file  Relationships  . Social connections:    Talks on phone: Not on file    Gets together: Not on file    Attends religious service: Not on file    Active member of club or organization: Not on file    Attends meetings of clubs or organizations: Not on file    Relationship status: Not on file  . Intimate partner violence:    Fear of current or ex partner: Not on file    Emotionally abused: Not on file    Physically abused: Not on file    Forced sexual activity: Not on file  Other Topics Concern  . Not on file  Social History Narrative  . Not on file     Family History  Problem Relation Age of Onset  . Heart disease Mother   . Heart attack Father      Review of Systems Positive for left sided axillary pain and shortness of breath with palpitations Negative for: General:  chills, fever, night sweats or weight changes.  Cardiovascular: PND orthopnea syncope dizziness  Dermatological skin lesions rashes Respiratory: Cough congestion Urologic: Frequent urination urination at night and hematuria Abdominal: negative for nausea, vomiting, diarrhea, bright red blood per rectum, melena, or hematemesis Neurologic: negative for visual changes, and/or hearing changes  All other systems reviewed and are otherwise negative except as noted above.  Labs: No results for input(s): CKTOTAL, CKMB, TROPONINI in the last 72 hours. Lab Results  Component Value Date   WBC 7.4 06/22/2018    HGB 14.9 06/22/2018   HCT 44.7 06/22/2018   MCV 94.1 06/22/2018   PLT 202 06/22/2018   No results for input(s): NA, K, CL, CO2, BUN, CREATININE, CALCIUM, PROT, BILITOT, ALKPHOS, ALT, AST, GLUCOSE in the last 168 hours.  Invalid input(s): LABALBU No results found for: CHOL, HDL, LDLCALC, TRIG No results found for: DDIMER  Radiology/Studies:  Dg Chest 2 View  Result Date: 07/02/2018 CLINICAL DATA:  Chest tube placement. EXAM: CHEST - 2 VIEW COMPARISON:  Radiographs of July 01, 2018. FINDINGS: Stable cardiomegaly. Left-sided chest tube is unchanged in position. Minimal left apical pneumothorax is noted. Mild left basilar atelectasis is noted. Right lung is clear. Bony thorax is unremarkable. IMPRESSION: Stable position of left-sided chest tube with minimal left apical pneumothorax. Mild left basilar subsegmental atelectasis is  noted. Electronically Signed   By: Marijo Conception, M.D.   On: 07/02/2018 09:29   Dg Chest 2 View  Result Date: 07/01/2018 CLINICAL DATA:  Postop check.  Status post left thoracoscopy. EXAM: CHEST - 2 VIEW COMPARISON:  06/30/2018 and older exams. FINDINGS: Left chest tube is stable, tip near the left apex.  No pneumothorax. Mild opacity along the lateral left mid to lower lung and at the left lung base consistent with atelectasis. Remainder of the lungs is clear. Lung volumes are low. Minimal left pleural effusion. Cardiac silhouette is mildly enlarged. No mediastinal or hilar masses. IMPRESSION: 1. No significant change from the prior study. Stable chest tube. No pneumothorax. 2. Persistent mild left mid to lower lung atelectasis with minimal left pleural fluid. Electronically Signed   By: Lajean Manes M.D.   On: 07/01/2018 11:32   Dg Chest 2 View  Result Date: 06/22/2018 CLINICAL DATA:  Preop evaluation for upcoming lung biopsy EXAM: CHEST - 2 VIEW COMPARISON:  06/09/2018 FINDINGS: Cardiac shadow is stable in appearance. Linear density is noted overlying the left  upper lung stable from the prior exam. No focal infiltrate or sizable effusion is seen. No bony abnormality is noted. IMPRESSION: Stable changes similar to that seen on prior CT and PET examination in the left upper lobe. Electronically Signed   By: Inez Catalina M.D.   On: 06/22/2018 14:47   Nm Pet Image Initial (pi) Skull Base To Thigh  Result Date: 06/09/2018 CLINICAL DATA:  Initial treatment strategy for pulmonary nodularity. EXAM: NUCLEAR MEDICINE PET SKULL BASE TO THIGH TECHNIQUE: 16.7 mCi F-18 FDG was injected intravenously. Full-ring PET imaging was performed from the skull base to thigh after the radiotracer. CT data was obtained and used for attenuation correction and anatomic localization. Fasting blood glucose: 76 mg/dl COMPARISON:  05/26/2018 FINDINGS: Mediastinal blood pool activity: SUV max 3.63 NECK: No hypermetabolic lymph nodes in the neck. Incidental CT findings: none CHEST: Linear band in the superior segment of the LEFT lower lobe paralleling pleural surface measuring 41 x 7 mm has intense associated metabolic activity SUV max equal 8.6. The subpleural ground-glass opacity in the adjacent LEFT upper lobe has mild metabolic activity. Small subpleural nodules in the LEFT lower lobe (image 132/3) also mild activity) in favored reactive Nodularity in the superior segment of the RIGHT lower lobe adjacent to a spinal osteophyte (image 102/3) also has mild activity SUV max 3.1 and favored reactive. No hypermetabolic mediastinal lymph nodes. Incidental CT findings: none ABDOMEN/PELVIS: No abnormal hypermetabolic activity within the liver, pancreas, adrenal glands, or spleen. No hypermetabolic lymph nodes in the abdomen or pelvis. Incidental CT findings: LEFT iliac vein stent noted. SKELETON: No focal hypermetabolic activity to suggest skeletal metastasis. Incidental CT findings: none IMPRESSION: 1. Linear band in the LEFT lower lobe along the pleural surface has intense metabolic activity. Favor  inflammatory process however, as there is no comparison and activity is intense, recommend follow-up CT with contrast in 1-3 months. 2. Mild metabolic activity associated with subpleural bandlike thickening in the LEFT upper lobe is favored same process as the LEFT lower lobe although less hypermetabolic. 3. No evidence of metastatic adenopathy. 4. No evidence of distant metastatic disease. Electronically Signed   By: Suzy Bouchard M.D.   On: 06/09/2018 13:04   Dg Chest Port 1 View  Result Date: 06/30/2018 CLINICAL DATA:  Postop day 0 LEFT VATS and LEFT lung biopsy. EXAM: PORTABLE CHEST 1 VIEW COMPARISON:  06/22/2018 and earlier, including PET-CT 06/09/2018  and chest CT 05/26/2018. FINDINGS: Cardiac silhouette upper normal in size to perhaps slightly enlarged for AP portable technique. LEFT chest tube in place with no pneumothorax. Mild linear atelectasis at the lung bases. Pulmonary venous hypertension without overt edema. IMPRESSION: LEFT chest tube in place with no pneumothorax. Mild bibasilar atelectasis. Pulmonary venous hypertension without overt edema. Electronically Signed   By: Evangeline Dakin M.D.   On: 06/30/2018 11:47   Dg Outside Films Chest  Result Date: 06/22/2018 This examination belongs to an outside facility and is stored here for comparison purposes only.  Contact the originating outside institution for any associated report or interpretation.  Dg Outside Films Chest  Result Date: 06/22/2018 This examination belongs to an outside facility and is stored here for comparison purposes only.  Contact the originating outside institution for any associated report or interpretation.  Dg Outside Films Chest  Result Date: 06/22/2018 This examination belongs to an outside facility and is stored here for comparison purposes only.  Contact the originating outside institution for any associated report or interpretation.   EKG: Atrial fibrillation with rapid ventricular  rate  Weights: Filed Weights   06/30/18 0629  Weight: (!) 146.1 kg     Physical Exam: Blood pressure 111/73, pulse (!) 104, temperature 99.5 F (37.5 C), temperature source Axillary, resp. rate 18, height 6\' 4"  (1.93 m), weight (!) 146.1 kg, SpO2 93 %. Body mass index is 39.2 kg/m. General: Well developed, well nourished, in no acute distress. Head eyes ears nose throat: Normocephalic, atraumatic, sclera non-icteric, no xanthomas, nares are without discharge. No apparent thyromegaly and/or mass  Lungs: Normal respiratory effort.  no wheezes, no rales, left-sided rhonchi.  Heart: Irregular with normal S1 S2. no murmur gallop, no rub, PMI is normal size and placement, carotid upstroke normal without bruit, jugular venous pressure is normal Abdomen: Soft, non-tender, non-distended with normoactive bowel sounds. No hepatomegaly. No rebound/guarding. No obvious abdominal masses. Abdominal aorta is normal size without bruit Extremities: Trace edema. no cyanosis, no clubbing, no ulcers  Peripheral : 2+ bilateral upper extremity pulses, 2+ bilateral femoral pulses, 2+ bilateral dorsal pedal pulse Neuro: Alert and oriented. No facial asymmetry. No focal deficit. Moves all extremities spontaneously. Musculoskeletal: Normal muscle tone without kyphosis Psych:  Responds to questions appropriately with a normal affect.    Assessment: 68 year old male with left axillary pain from possible pneumonia deep venous thrombosis and now atrial fibrillation with rapid ventricular rate without evidence of heart failure or myocardial infarction  Plan: 1.  Continue working with heart rate control with oral diltiazem and oral beta-blocker for goal heart rate below 110 bpm 2.  Continue medication management for anticoagulation for further risk reduction of deep venous thrombosis pulmonary embolism and atrial fibrillation her stroke risk with either Lovenox short-term or Eliquis longer-term 3.  Further  consideration of echocardiogram for LV systolic dysfunction valvular heart disease 4.  No restrictions to activities and/or further surgical intervention  Signed, Corey Skains M.D. [facility] 07/02/2018, 3:49 PM

## 2018-07-02 NOTE — Progress Notes (Signed)
Paged Dr. Nehemiah Massed.   Fuller Mandril, RN

## 2018-07-02 NOTE — Progress Notes (Signed)
Called rapid.   Fuller Mandril, RN

## 2018-07-02 NOTE — Progress Notes (Signed)
Patient ID: John Jenkins, male   DOB: 06/20/1950, 68 y.o.   MRN: [id number]   Today when the patient left the floor to get his chest x-Javiel he developed atrial fibrillation with rapid ventricular response.  Currently his blood pressure is 153/88 with a saturation of 94% and a heart rate between 140 and 160 according to the monitor.  The patient is essentially asymptomatic at this time.  He is not short of breath.  He does not complain of any chest pain.  We will ask cardiology to see the patient for management.  His chest x-Kolbe shows a very small left apical pneumothorax.  I will leave his chest tube in today.  His pathology shows cryptococcus.  We will get infectious disease to see the patient.  Tim [employer]

## 2018-07-02 NOTE — Progress Notes (Signed)
Family by the bedside. Patient AOx4, denies chest pain, SOB or palpitations.   Fuller Mandril, RN

## 2018-07-03 ENCOUNTER — Inpatient Hospital Stay: Payer: Medicare HMO

## 2018-07-03 LAB — T-HELPER CELLS CD4/CD8 %
% CD 4 Pos. Lymph.: 49.7 % (ref 30.8–58.5)
Absolute CD 4 Helper: 1193 /uL (ref 359–1519)
Basophils Absolute: 0 10*3/uL (ref 0.0–0.2)
Basos: 0 %
CD3+CD4+ Cells/CD3+CD8+ Cells Bld: 1.31 (ref 0.92–3.72)
CD3+CD8+ Cells # Bld: 912 /uL — ABNORMAL HIGH (ref 109–897)
CD3+CD8+ Cells NFr Bld: 38 % — ABNORMAL HIGH (ref 12.0–35.5)
EOS (ABSOLUTE): 0.2 10*3/uL (ref 0.0–0.4)
EOS: 2 %
Hematocrit: 44.5 % (ref 37.5–51.0)
Hemoglobin: 15.3 g/dL (ref 13.0–17.7)
Immature Grans (Abs): 0 10*3/uL (ref 0.0–0.1)
Immature Granulocytes: 0 %
Lymphocytes Absolute: 2.4 10*3/uL (ref 0.7–3.1)
Lymphs: 21 %
MCH: 31.9 pg (ref 26.6–33.0)
MCHC: 34.4 g/dL (ref 31.5–35.7)
MCV: 93 fL (ref 79–97)
Monocytes Absolute: 1.1 10*3/uL — ABNORMAL HIGH (ref 0.1–0.9)
Monocytes: 9 %
NEUTROS ABS: 7.7 10*3/uL — AB (ref 1.4–7.0)
Neutrophils: 68 %
Platelets: 184 10*3/uL (ref 150–450)
RBC: 4.8 x10E6/uL (ref 4.14–5.80)
RDW: 12.9 % (ref 11.6–15.4)
WBC: 11.4 10*3/uL — ABNORMAL HIGH (ref 3.4–10.8)

## 2018-07-03 LAB — CRYPTOCOCCAL ANTIGEN: CRYPTO AG: NEGATIVE

## 2018-07-03 MED ORDER — APIXABAN 5 MG PO TABS
5.0000 mg | ORAL_TABLET | Freq: Two times a day (BID) | ORAL | Status: DC
  Administered 2018-07-03 (×2): 5 mg via ORAL
  Filled 2018-07-03 (×2): qty 1

## 2018-07-03 NOTE — Progress Notes (Signed)
[facility] Encounter Note  Patient: John Jenkins / Admit Date: 06/30/2018 / Date of Encounter: 07/03/2018, 10:17 AM   Subjective: Feels much better today.  Patient's had chest tube removed and having no evidence of pain or shortness of breath.  Patient does have cryptococcus infection which will be treated appropriately.  Patient had atrial fibrillation with rapid ventricular rate most consistent with current trauma pain and other issues now converted to normal sinus rhythm with current medical regimen.  The patient has tolerated the metoprolol and anticoagulation  Review of Systems: Positive for: None Negative for: Vision change, hearing change, syncope, dizziness, nausea, vomiting,diarrhea, bloody stool, stomach pain, cough, congestion, diaphoresis, urinary frequency, urinary pain,skin lesions, skin rashes Others previously listed  Objective: Telemetry: Normal sinus rhythm Physical Exam: Blood pressure 108/66, pulse 75, temperature 97.9 F (36.6 C), temperature source Oral, resp. rate 18, height 6\' 4"  (1.93 m), weight (!) 146.1 kg, SpO2 98 %. Body mass index is 39.2 kg/m. General: Well developed, well nourished, in no acute distress. Head: Normocephalic, atraumatic, sclera non-icteric, no xanthomas, nares are without discharge. Neck: No apparent masses Lungs: Normal respirations with no wheezes, no rhonchi, no rales , no crackles   Heart: Regular rate and rhythm, normal S1 S2, no murmur, no rub, no gallop, PMI is normal size and placement, carotid upstroke normal without bruit, jugular venous pressure normal Abdomen: Soft, non-tender, non-distended with normoactive bowel sounds. No hepatosplenomegaly. Abdominal aorta is normal size without bruit Extremities: No edema, no clubbing, no cyanosis, no ulcers,  Peripheral: 2+ radial, 2+ femoral, 2+ dorsal pedal pulses Neuro: Alert and oriented. Moves all extremities spontaneously. Psych:  Responds to questions  appropriately with a normal affect.   Intake/Output Summary (Last 24 hours) at 07/03/2018 1017 Last data filed at 07/03/2018 0941 Gross per 24 hour  Intake 1000 ml  Output 180 ml  Net 820 ml    Inpatient Medications:  . albuterol  2.5 mg Nebulization BID  . apixaban  5 mg Oral BID  . bisacodyl  10 mg Oral Daily  . metoprolol tartrate  50 mg Oral BID  . traMADol  50-100 mg Oral Q6H   Infusions:  . fluconazole (DIFLUCAN) IV Stopped (07/02/18 1925)    Labs: Recent Labs    07/02/18 1702  NA 133*  K 3.8  CL 102  CO2 25  GLUCOSE 130*  BUN 12  CREATININE 0.72  CALCIUM 8.9   Recent Labs    07/02/18 1702  AST 19  ALT 22  ALKPHOS 54  BILITOT 1.3*  PROT 7.1  ALBUMIN 3.4*   Recent Labs    07/02/18 1702  WBC 11.2*  NEUTROABS 7.5  HGB 15.4  HCT 45.4  MCV 93.8  PLT 187   No results for input(s): CKTOTAL, CKMB, TROPONINI in the last 72 hours. Invalid input(s): POCBNP No results for input(s): HGBA1C in the last 72 hours.   Weights: Filed Weights   06/30/18 0629  Weight: (!) 146.1 kg     Radiology/Studies:  Dg Chest 2 View  Result Date: 07/03/2018 CLINICAL DATA:  Left lower lobe lung biopsy/VATS 06/30/2018. EXAM: CHEST - 2 VIEW COMPARISON:  Radiographs 07/02/2018 and 07/01/2018. FINDINGS: Left chest tube is unchanged in position. There is a stable tiny left apical pneumothorax. A small amount of loculated left pleural fluid versus thickening and adjacent subpleural pulmonary opacities are stable. The right lung is clear. The heart size and mediastinal contours are stable. IMPRESSION: Stable postoperative chest with minimal left apical pneumothorax.  Electronically Signed   By: Richardean Sale M.D.   On: 07/03/2018 08:55   Dg Chest 2 View  Result Date: 07/02/2018 CLINICAL DATA:  Chest tube placement. EXAM: CHEST - 2 VIEW COMPARISON:  Radiographs of July 01, 2018. FINDINGS: Stable cardiomegaly. Left-sided chest tube is unchanged in position. Minimal left apical  pneumothorax is noted. Mild left basilar atelectasis is noted. Right lung is clear. Bony thorax is unremarkable. IMPRESSION: Stable position of left-sided chest tube with minimal left apical pneumothorax. Mild left basilar subsegmental atelectasis is noted. Electronically Signed   By: Marijo Conception, M.D.   On: 07/02/2018 09:29   Dg Chest 2 View  Result Date: 07/01/2018 CLINICAL DATA:  Postop check.  Status post left thoracoscopy. EXAM: CHEST - 2 VIEW COMPARISON:  06/30/2018 and older exams. FINDINGS: Left chest tube is stable, tip near the left apex.  No pneumothorax. Mild opacity along the lateral left mid to lower lung and at the left lung base consistent with atelectasis. Remainder of the lungs is clear. Lung volumes are low. Minimal left pleural effusion. Cardiac silhouette is mildly enlarged. No mediastinal or hilar masses. IMPRESSION: 1. No significant change from the prior study. Stable chest tube. No pneumothorax. 2. Persistent mild left mid to lower lung atelectasis with minimal left pleural fluid. Electronically Signed   By: Lajean Manes M.D.   On: 07/01/2018 11:32   Dg Chest 2 View  Result Date: 06/22/2018 CLINICAL DATA:  Preop evaluation for upcoming lung biopsy EXAM: CHEST - 2 VIEW COMPARISON:  06/09/2018 FINDINGS: Cardiac shadow is stable in appearance. Linear density is noted overlying the left upper lung stable from the prior exam. No focal infiltrate or sizable effusion is seen. No bony abnormality is noted. IMPRESSION: Stable changes similar to that seen on prior CT and PET examination in the left upper lobe. Electronically Signed   By: Inez Catalina M.D.   On: 06/22/2018 14:47   Nm Pet Image Initial (pi) Skull Base To Thigh  Result Date: 06/09/2018 CLINICAL DATA:  Initial treatment strategy for pulmonary nodularity. EXAM: NUCLEAR MEDICINE PET SKULL BASE TO THIGH TECHNIQUE: 16.7 mCi F-18 FDG was injected intravenously. Full-ring PET imaging was performed from the skull base to thigh  after the radiotracer. CT data was obtained and used for attenuation correction and anatomic localization. Fasting blood glucose: 76 mg/dl COMPARISON:  05/26/2018 FINDINGS: Mediastinal blood pool activity: SUV max 3.63 NECK: No hypermetabolic lymph nodes in the neck. Incidental CT findings: none CHEST: Linear band in the superior segment of the LEFT lower lobe paralleling pleural surface measuring 41 x 7 mm has intense associated metabolic activity SUV max equal 8.6. The subpleural ground-glass opacity in the adjacent LEFT upper lobe has mild metabolic activity. Small subpleural nodules in the LEFT lower lobe (image 132/3) also mild activity) in favored reactive Nodularity in the superior segment of the RIGHT lower lobe adjacent to a spinal osteophyte (image 102/3) also has mild activity SUV max 3.1 and favored reactive. No hypermetabolic mediastinal lymph nodes. Incidental CT findings: none ABDOMEN/PELVIS: No abnormal hypermetabolic activity within the liver, pancreas, adrenal glands, or spleen. No hypermetabolic lymph nodes in the abdomen or pelvis. Incidental CT findings: LEFT iliac vein stent noted. SKELETON: No focal hypermetabolic activity to suggest skeletal metastasis. Incidental CT findings: none IMPRESSION: 1. Linear band in the LEFT lower lobe along the pleural surface has intense metabolic activity. Favor inflammatory process however, as there is no comparison and activity is intense, recommend follow-up CT with contrast in 1-3  months. 2. Mild metabolic activity associated with subpleural bandlike thickening in the LEFT upper lobe is favored same process as the LEFT lower lobe although less hypermetabolic. 3. No evidence of metastatic adenopathy. 4. No evidence of distant metastatic disease. Electronically Signed   By: Suzy Bouchard M.D.   On: 06/09/2018 13:04   Dg Chest Port 1 View  Result Date: 06/30/2018 CLINICAL DATA:  Postop day 0 LEFT VATS and LEFT lung biopsy. EXAM: PORTABLE CHEST 1 VIEW  COMPARISON:  06/22/2018 and earlier, including PET-CT 06/09/2018 and chest CT 05/26/2018. FINDINGS: Cardiac silhouette upper normal in size to perhaps slightly enlarged for AP portable technique. LEFT chest tube in place with no pneumothorax. Mild linear atelectasis at the lung bases. Pulmonary venous hypertension without overt edema. IMPRESSION: LEFT chest tube in place with no pneumothorax. Mild bibasilar atelectasis. Pulmonary venous hypertension without overt edema. Electronically Signed   By: Evangeline Dakin M.D.   On: 06/30/2018 11:47   Dg Outside Films Chest  Result Date: 06/22/2018 This examination belongs to an outside facility and is stored here for comparison purposes only.  Contact the originating outside institution for any associated report or interpretation.  Dg Outside Films Chest  Result Date: 06/22/2018 This examination belongs to an outside facility and is stored here for comparison purposes only.  Contact the originating outside institution for any associated report or interpretation.  Dg Outside Films Chest  Result Date: 06/22/2018 This examination belongs to an outside facility and is stored here for comparison purposes only.  Contact the originating outside institution for any associated report or interpretation.    Assessment and Recommendation  68 y.o. male with cryptococcus infection essential hypertension deep venous thrombosis now with atrial fibrillation with rapid ventricular rate now spontaneously converted to normal sinus rhythm on appropriate medication management 1.  Continue metoprolol for maintenance of normal sinus rhythm hypertension control 2.  Continue anticoagulation for further risk reduction in stroke with atrial fibrillation 3.  No further cardiac diagnostics necessary at this time 4.  Begin ambulation and follow for improvements of symptoms and possible discharged home from cardiac standpoint with follow-up in 1 to 2 weeks for further adjustments of  medication management  Signed, Serafina Royals M.D. FACC

## 2018-07-03 NOTE — Progress Notes (Signed)
Patient ID: John Jenkins, male   DOB: [birth year], 68 y.o.   MRN: [id number]  Wilburn Mylar he developed an episode of atrial fibrillation and this is been managed with oral calcium channel blockers and beta-blockers.  This morning he appears to be in sinus rhythm with a regular heart rate in the 60-70 range.  His blood pressure is also better controlled as well.  He does not have any specific complaints today except for some mild chest discomfort.  He was seen yesterday by cardiology, pulmonary and infectious disease all of whom have significantly contributed to his care.  We appreciate that.  His lungs are distant but equal bilaterally.  His heart is regular today.  I do not appreciate any murmurs.  There is no air leak and independent review of his chest x-Hasson shows a very tiny apical pneumothorax.  I will discontinue his chest tube today.  We will stop his subcutaneous heparin as he will not require a lumbar puncture.  I will begin him on Eliquis.  We will await the results of the consultants input for today prior to making any decisions about long-term antimicrobial therapy

## 2018-07-03 NOTE — Plan of Care (Signed)
  Problem: Clinical Measurements: Goal: Diagnostic test results will improve Outcome: Progressing   Problem: Pain Managment: Goal: General experience of comfort will improve Outcome: Progressing   Problem: Safety: Goal: Ability to remain free from injury will improve Outcome: Progressing   Problem: Skin Integrity: Goal: Risk for impaired skin integrity will decrease Outcome: Progressing   

## 2018-07-03 NOTE — Progress Notes (Signed)
Called infectious disease md. Dr. Dietrich [place] to make aware patient may be discharged tomorrow per dr. Genevive Bi and per per dr. Genevive Bi request consulting md needs to give recommendations for antibiotics for patient current infections. No response no call back at this time

## 2018-07-03 NOTE — Progress Notes (Signed)
Physical Therapy Treatment Patient Details Name: John Jenkins MRN: [id number] DOB: 1950-10-16 Today's Date: 07/03/2018    History of Present Illness Pt is 68 yo male s/p Left thoracoscopy with biopsy of left lower lobe 06/30/2018. PMH of DVT, obesity    PT Comments    Chest tube removed this am.  Bed mobility and ambulated x 2 around unit with walker and modified independent.  No LOB or buckling noted.  Safe gait.  HR remained in 80's and stable during session.     Follow Up Recommendations  Home health PT     Equipment Recommendations  Rolling walker with 5" wheels    Recommendations for Other Services       Precautions / Restrictions Precautions Precaution Comments: chest tube - removed 11/15    Mobility  Bed Mobility Overal bed mobility: Modified Independent                Transfers Overall transfer level: Modified independent                  Ambulation/Gait Ambulation/Gait assistance: Modified independent (Device/Increase time) Gait Distance (Feet): 350 Feet Assistive device: Rolling walker (2 wheeled) Gait Pattern/deviations: Step-through pattern Gait velocity: decreased   General Gait Details: steady with no LOB or buckling   Stairs             Wheelchair Mobility    Modified Rankin (Stroke Patients Only)       Balance Overall balance assessment: Mild deficits observed, not formally tested                                          Cognition Arousal/Alertness: Awake/alert Behavior During Therapy: WFL for tasks assessed/performed Overall Cognitive Status: Within Functional Limits for tasks assessed                                        Exercises      General Comments        Pertinent Vitals/Pain Pain Assessment: Faces Faces Pain Scale: Hurts a little bit Pain Location: L side, with L arm movement Pain Descriptors / Indicators: Sore Pain Intervention(s): Limited activity within  patient's tolerance    Home Living                      Prior Function            PT Goals (current goals can now be found in the care plan section) Progress towards PT goals: Progressing toward goals    Frequency    Min 2X/week      PT Plan Current plan remains appropriate    Co-evaluation              AM-PAC PT "6 Clicks" Mobility   Outcome Measure  Help needed turning from your back to your side while in a flat bed without using bedrails?: A Little Help needed moving from lying on your back to sitting on the side of a flat bed without using bedrails?: A Little Help needed moving to and from a bed to a chair (including a wheelchair)?: None Help needed standing up from a chair using your arms (e.g., wheelchair or bedside chair)?: None Help needed to walk in hospital room?: None Help needed climbing 3-5 steps with  a railing? : A Little 6 Click Score: 21    End of Session Equipment Utilized During Treatment: Gait belt Activity Tolerance: Patient tolerated treatment well Patient left: in bed;with call bell/phone within reach;with bed alarm set Nurse Communication: Mobility status       Time: 0919-8022 PT Time Calculation (min) (ACUTE ONLY): 11 min  Charges:  $Gait Training: 8-22 mins                     Chesley Noon, PTA 07/03/18, 11:29 AM

## 2018-07-04 MED ORDER — APIXABAN 2.5 MG PO TABS
2.5000 mg | ORAL_TABLET | Freq: Two times a day (BID) | ORAL | Status: DC
  Administered 2018-07-04: 2.5 mg via ORAL
  Filled 2018-07-04: qty 1

## 2018-07-04 MED ORDER — FLUCONAZOLE 100 MG PO TABS
800.0000 mg | ORAL_TABLET | Freq: Every day | ORAL | Status: DC
  Administered 2018-07-04: 800 mg via ORAL
  Filled 2018-07-04: qty 8

## 2018-07-04 MED ORDER — APIXABAN 2.5 MG PO TABS: 2.5000 mg | ORAL_TABLET | Freq: Two times a day (BID) | ORAL | 0 refills | Status: DC

## 2018-07-04 MED ORDER — FLUCONAZOLE 200 MG PO TABS: 800.0000 mg | ORAL_TABLET | Freq: Every day | ORAL | 0 refills | Status: DC

## 2018-07-04 MED ORDER — METOPROLOL TARTRATE 50 MG PO TABS: 50.0000 mg | ORAL_TABLET | Freq: Two times a day (BID) | ORAL | 0 refills | Status: AC

## 2018-07-04 MED ORDER — TRAMADOL HCL 50 MG PO TABS: 50.0000 mg | ORAL_TABLET | Freq: Four times a day (QID) | ORAL | 0 refills | Status: AC

## 2018-07-04 NOTE — Care Management Note (Signed)
Case Management Note  Patient Details  Name: [name] MRN: [id number] Date of Birth: 1951/01/31  Subjective/Objective:  DME rolling walker provided. Previous RNCM notes show patient has set up home health on his own and there is no need for orders as [facility] has received them. Patient comfortable with discharge planning and has no further needs                  Action/Plan:   Expected Discharge Date:  07/04/18               Expected Discharge Plan:  [place]  In-House Referral:     Discharge planning Services  CM Consult  Post Acute Care Choice:    Choice offered to:  Patient  DME Arranged:  Walker rolling DME Agency:  [place]:    [place] Agency:  [facility]  Status of Service:  Completed, signed off  If discussed at [place] of Stay Meetings, dates discussed:    Additional Comments:  Latanya Maudlin, RN 07/04/2018, 12:07 PM

## 2018-07-04 NOTE — Discharge Summary (Signed)
Physician Discharge Summary  Patient ID: CORRIN HINGLE MRN: [id number] DOB/AGE: 08/11/1950 68 y.o.  Admit date: 06/30/2018 Discharge date: 07/04/2018   Discharge Diagnoses:  Active Problems:   Lung mass   Interstitial lung disease ([place])   Procedures: Left thoracoscopy with lung biopsy`  Hospital Course: Patient has a history of recurrent pulmonary infiltrates and was taken to the operating room for a definitive lung biopsy.  Histology showed Cryptococcus.  Cultures are pending.  He also developed atrial fibrillation and was treated with calcium channel blockers and beta blockers for rate control.  He spontaneously converted to sinus rhythm after about 12 hours.  He was seen by Pulmonary, Cardiology and ID consultations were obtained.  At the time of discharge he was being treated with Metoprolol and Fluconazole as well as Eliquis which he had been on.  His chest tube was removed on POD #3 and he was discharged on POD #4.  His chest xray showed the expected postop findings and his wounds were clean and dry.  He will followup with Pulmonary, Vascular Surgery, Thoracic Surgery, ID and Cardiology in the next 1 - 2 weeks.  Disposition: Discharge disposition: 01-Home or Self Care       Discharge Instructions    Call MD for:  difficulty breathing, headache or visual disturbances   Complete by:  As directed    Call MD for:  hives   Complete by:  As directed    Call MD for:  redness, tenderness, or signs of infection (pain, swelling, redness, odor or green/yellow discharge around incision site)   Complete by:  As directed    Diet - low sodium heart healthy   Complete by:  As directed    Increase activity slowly   Complete by:  As directed         Nestor Lewandowsky, MD

## 2018-07-04 NOTE — Progress Notes (Signed)
  Patient ID: John Jenkins, male   DOB: 02-Oct-1950, 68 y.o.   MRN: [id number]  HISTORY: Had a quiet night.  Afebrile.  Not short of breath.   Vitals:   07/04/18 0313 07/04/18 0745  BP: 131/71 124/74  Pulse: 63 64  Resp:    Temp: 97.8 F (36.6 C) 97.8 F (36.6 C)  SpO2: 96% 98%     EXAM:    Resp: Lungs are clear bilaterally.  No respiratory distress, normal effort.  Heart:  Regular without murmurs. Abd:  Abdomen is soft, non distended and non tender. No masses are palpable.  There is no rebound and no guarding.  Neurological: Alert and oriented to person, place, and time. Coordination normal.  Skin: Skin is warm and dry. No rash noted. No diaphoretic. No erythema. No pallor. Wounds are clean and dry.  Dressing over chest tube site Psychiatric: Normal mood and affect. Normal behavior. Judgment and thought content normal.    ASSESSMENT: Cryptococcus lung disease   PLAN:   Talked to ID regarding his fluconazole.  He recommended that we change IV to PO at 800 mg per day.  Will discharge to home today.  Will see me in one week.  Will followup with Vascular and Pulmonary next week as well.    Nestor Lewandowsky, MDPatient ID: John Jenkins, male   DOB: 23-Apr-1951, 68 y.o.   MRN: [id number]

## 2018-07-04 NOTE — Progress Notes (Signed)
Patient ID: John Jenkins, male   DOB: [birth year], 68 y.o.   MRN: [id number]  I did discuss his Eliquis dose with our pharmacist.  The recommendation was to reduce the dose in half to 2.5 mg twice a day while on Diflucan.  I relayed this to the patient and he will cut his tablets in half and follow-up with Dr. Delana Meyer and vascular surgery next week.

## 2018-07-04 NOTE — Progress Notes (Signed)
John Jenkins to be D/C'd Home with home health per MD order.  Discussed prescriptions and follow up appointments with the patient. Prescriptions given to patient, medication list explained in detail. Pt verbalized understanding.  Allergies as of 07/04/2018   No Known Allergies     Medication List    TAKE these medications   apixaban 5 MG Tabs tablet Commonly known as:  ELIQUIS Take 10 mg PO BID X 6 days and then start 5 mg PO BID What changed:    how much to take  how to take this  when to take this  additional instructions   apixaban 2.5 MG Tabs tablet Commonly known as:  ELIQUIS Take 1 tablet (2.5 mg total) by mouth 2 (two) times daily. What changed:  You were already taking a medication with the same name, and this prescription was added. Make sure you understand how and when to take each.   CAL-MAG-ZINC PO Take 1 tablet by mouth daily.   COLLAGEN-VITAMIN C PO Take 1 tablet by mouth daily.   FISH OIL PO Take 1 capsule by mouth daily.   fluconazole 200 MG tablet Commonly known as:  DIFLUCAN Take 4 tablets (800 mg total) by mouth daily.   fluticasone 50 MCG/ACT nasal spray Commonly known as:  FLONASE Place 2 sprays into the nose daily.   loratadine 10 MG tablet Commonly known as:  CLARITIN Take 10 mg by mouth at bedtime.   metoprolol tartrate 50 MG tablet Commonly known as:  LOPRESSOR Take 1 tablet (50 mg total) by mouth 2 (two) times daily.   multivitamin with minerals Tabs tablet Take 1 tablet by mouth daily. Centrum Silver   potassium gluconate 595 (99 K) MG Tabs tablet Take 595 mg by mouth daily.   senna-docusate 8.6-50 MG tablet Commonly known as:  Senokot-S Take 1 tablet by mouth daily as needed for mild constipation.   traMADol 50 MG tablet Commonly known as:  ULTRAM Take 1-2 tablets (50-100 mg total) by mouth every 6 (six) hours.   vitamin B-12 1000 MCG tablet Commonly known as:  CYANOCOBALAMIN Take 1,000 mcg by mouth daily.   vitamin C  500 MG tablet Commonly known as:  ASCORBIC ACID Take 500 mg by mouth daily.            Durable Medical Equipment  (From admission, onward)         Start     Ordered   07/04/18 1207  For home use only DME Walker rolling  Once    Question:  Patient needs a walker to treat with the following condition  Answer:  Weakness   07/04/18 1206          Vitals:   07/04/18 0313 07/04/18 0745  BP: 131/71 124/74  Pulse: 63 64  Resp:    Temp: 97.8 F (36.6 C) 97.8 F (36.6 C)  SpO2: 96% 98%    Tele box removed and returned. Skin clean, dry and intact without evidence of skin break down, no evidence of skin tears noted. IV catheter discontinued intact. Site without signs and symptoms of complications. Dressing and pressure applied. Pt denies pain at this time. No complaints noted.  An After Visit Summary was printed and given to the patient. Patient escorted via [name], and D/C home via private auto.  John Jenkins

## 2018-07-05 LAB — AEROBIC/ANAEROBIC CULTURE W GRAM STAIN (SURGICAL/DEEP WOUND): Culture: NO GROWTH

## 2018-07-07 ENCOUNTER — Ambulatory Visit (INDEPENDENT_AMBULATORY_CARE_PROVIDER_SITE_OTHER): Payer: Medicare HMO | Admitting: Vascular Surgery

## 2018-07-07 ENCOUNTER — Encounter (INDEPENDENT_AMBULATORY_CARE_PROVIDER_SITE_OTHER): Payer: Self-pay | Admitting: Vascular Surgery

## 2018-07-07 ENCOUNTER — Telehealth (INDEPENDENT_AMBULATORY_CARE_PROVIDER_SITE_OTHER): Payer: Self-pay

## 2018-07-07 ENCOUNTER — Other Ambulatory Visit: Payer: Self-pay

## 2018-07-07 ENCOUNTER — Ambulatory Visit (INDEPENDENT_AMBULATORY_CARE_PROVIDER_SITE_OTHER): Payer: Medicare HMO

## 2018-07-07 ENCOUNTER — Other Ambulatory Visit (INDEPENDENT_AMBULATORY_CARE_PROVIDER_SITE_OTHER): Payer: Self-pay | Admitting: Vascular Surgery

## 2018-07-07 ENCOUNTER — Encounter (INDEPENDENT_AMBULATORY_CARE_PROVIDER_SITE_OTHER): Payer: Self-pay

## 2018-07-07 VITALS — BP 128/82 | HR 60 | Resp 17 | Ht 76.0 in | Wt 315.8 lb

## 2018-07-07 DIAGNOSIS — R200 Anesthesia of skin: Secondary | ICD-10-CM

## 2018-07-07 DIAGNOSIS — R600 Localized edema: Secondary | ICD-10-CM

## 2018-07-07 DIAGNOSIS — R202 Paresthesia of skin: Secondary | ICD-10-CM

## 2018-07-07 DIAGNOSIS — I82402 Acute embolism and thrombosis of unspecified deep veins of left lower extremity: Secondary | ICD-10-CM

## 2018-07-07 DIAGNOSIS — Z95828 Presence of other vascular implants and grafts: Secondary | ICD-10-CM | POA: Diagnosis not present

## 2018-07-07 DIAGNOSIS — J849 Interstitial pulmonary disease, unspecified: Secondary | ICD-10-CM

## 2018-07-07 NOTE — Progress Notes (Signed)
Subjective:    Patient ID: John Jenkins, male    DOB: 10/23/1950, 68 y.o.   MRN: [id number] Chief Complaint  Patient presents with  . Follow-up    6week le dvt and abi   Patient presents for a 8-week follow-up.  The patient is status post a left lower extremity venous lysis with IVC filter placement on 05/04/18.  The patient continues to take Eliquis 5 mg 1 tab by mouth twice daily.  The patient is engaging conservative therapy including wearing medical grade 1 compression socks, elevating his legs and remaining active.  The patient still experiences some swelling to left lower extremity however this has improved since his initial diagnosis.  The patient underwent a bilateral ABI which was notable for: Right: 1.34, triphasic tibials, no evidence of significant right lower extremity arterial disease Left: 1.34, phasic tibials, evidence of significant left lower extremity arterial disease The patient also underwent a left lower extremity arterial duplex which was notable for: No right common femoral vein obstruction.   Left lower extremity consistent with deep vein thrombosis involving the left common femoral vein, left femoral vein, left popliteal vein, left posterior tibial vein and left peroneal vein.  Findings appear improved from the previous duplex.  There is partial compression in the left external iliac vein, and left common femoral vein, left proximal saphenofemoral vein with some recannulation flow in these areas.  Left mid distal SFA is totally occluded from thrombus.  Left greater saphenous vein is compressible throughout. Patient denies any ulcer formation to left lower extremity.  Patient denies any recent or recurrent bouts of cellulitis to left lower extremity.  Patient denies any fever, nausea vomiting.  Review of Systems  Constitutional: Negative.   HENT: Negative.   Eyes: Negative.   Respiratory: Negative.   Cardiovascular: Positive for leg swelling.       Left Lower  Extremity DVT  Gastrointestinal: Negative.   Endocrine: Negative.   Genitourinary: Negative.   Musculoskeletal: Negative.   Skin: Negative.   Allergic/Immunologic: Negative.   Neurological: Negative.   Hematological: Negative.   Psychiatric/Behavioral: Negative.       Objective:   Physical Exam Vitals signs reviewed.  Constitutional:      Appearance: Normal appearance. He is obese.  HENT:     Head: Normocephalic and atraumatic.     Right Ear: External ear normal.     Left Ear: External ear normal.     Nose: Nose normal.     Mouth/Throat:     Mouth: Mucous membranes are moist.     Pharynx: Oropharynx is clear.  Eyes:     Extraocular Movements: Extraocular movements intact.     Conjunctiva/sclera: Conjunctivae normal.     Pupils: Pupils are equal, round, and reactive to light.  Neck:     Musculoskeletal: Normal range of motion.  Cardiovascular:     Rate and Rhythm: Normal rate and regular rhythm.     Comments: Hard to palpate pedal pulses due to body habitus and edema however the bilateral feet are warm and there is a good capillary refill. Pulmonary:     Effort: Pulmonary effort is normal.     Breath sounds: Normal breath sounds.  Musculoskeletal: Normal range of motion.        General: Swelling (Mild right lower extremity edema.  Mild to moderate left lower extremity edema.) present.  Skin:    General: Skin is warm and dry.  Neurological:     General: No focal deficit present.  Mental Status: He is alert and oriented to person, place, and time. Mental status is at baseline.  Psychiatric:        Mood and Affect: Mood normal.        Behavior: Behavior normal.        Thought Content: Thought content normal.        Judgment: Judgment normal.    BP 128/82 (BP Location: Right Arm)   Pulse 60   Resp 17   Ht 6\' 4"  (1.93 m)   Wt (!) 315 lb 12.8 oz (143.2 kg)   BMI 38.44 kg/m   Past Medical History:  Diagnosis Date  . DVT (deep venous thrombosis) ([facility]) 2019  .  Obese   . Pneumonia   . Seasonal allergies    Social History   Socioeconomic History  . Marital status: Single    Spouse name: Not on file  . Number of children: Not on file  . Years of education: Not on file  . Highest education level: Not on file  Occupational History  . Not on file  Social Needs  . Financial resource strain: Not on file  . Food insecurity:    Worry: Not on file    Inability: Not on file  . Transportation needs:    Medical: Not on file    Non-medical: Not on file  Tobacco Use  . Smoking status: Never Smoker  . Smokeless tobacco: Never Used  Substance and Sexual Activity  . Alcohol use: Never    Frequency: Never  . Drug use: Never  . Sexual activity: Not on file  Lifestyle  . Physical activity:    Days per week: Not on file    Minutes per session: Not on file  . Stress: Not on file  Relationships  . Social connections:    Talks on phone: Not on file    Gets together: Not on file    Attends religious service: Not on file    Active member of club or organization: Not on file    Attends meetings of clubs or organizations: Not on file    Relationship status: Not on file  . Intimate partner violence:    Fear of current or ex partner: Not on file    Emotionally abused: Not on file    Physically abused: Not on file    Forced sexual activity: Not on file  Other Topics Concern  . Not on file  Social History Narrative  . Not on file   Past Surgical History:  Procedure Laterality Date  . ANKLE SURGERY  1974  . broken arm  1970  . PERIPHERAL VASCULAR THROMBECTOMY Left 05/04/2018   Procedure: PERIPHERAL VASCULAR THROMBECTOMY;  Surgeon: Katha Cabal, MD;  Location: [facility];  Service: Cardiovascular;  Laterality: Left;  Marland Kitchen VIDEO ASSISTED THORACOSCOPY (VATS)/THOROCOTOMY Left 06/30/2018   Procedure: VIDEO ASSISTED THORACOSCOPY (VATS)/THOROCOTOMY WITH LUNG BIOPSY-LEFT  PRE-OP BRONCH;  Surgeon: Nestor Lewandowsky, MD;  Location: [facility];   Service: General;  Laterality: Left;   Family History  Problem Relation Age of Onset  . Heart disease Mother   . Heart attack Father    No Known Allergies     Assessment & Plan:  Patient presents for a 8-week follow-up.  The patient is status post a left lower extremity venous lysis with IVC filter placement on 05/04/18.  The patient continues to take Eliquis 5 mg 1 tab by mouth twice daily.  The patient is engaging conservative therapy including wearing  medical grade 1 compression socks, elevating his legs and remaining active.  The patient still experiences some swelling to left lower extremity however this has improved since his initial diagnosis.  The patient underwent a bilateral ABI which was notable for: Right: 1.34, triphasic tibials, no evidence of significant right lower extremity arterial disease Left: 1.34, phasic tibials, evidence of significant left lower extremity arterial disease The patient also underwent a left lower extremity arterial duplex which was notable for: No right common femoral vein obstruction.   Left lower extremity consistent with deep vein thrombosis involving the left common femoral vein, left femoral vein, left popliteal vein, left posterior tibial vein and left peroneal vein.  Findings appear improved from the previous duplex.  There is partial compression in the left external iliac vein, and left common femoral vein, left proximal saphenofemoral vein with some recannulation flow in these areas.  Left mid distal SFA is totally occluded from thrombus.  Left greater saphenous vein is compressible throughout. Patient denies any ulcer formation to left lower extremity.  Patient denies any recent or recurrent bouts of cellulitis to left lower extremity.  Patient denies any fever, nausea vomiting.  1. Deep vein thrombosis (DVT) of left lower extremity, unspecified chronicity, unspecified vein (HCC) - Stable Patient presents for a 8-week status post IVC filter  placement /left lower extremity venous lysis for DVT There is some improvement to the patient's left lower extremity DVT when compared to the previous duplex Patient is currently out of the acute phase, and we will plan to remove his IVC filter The patient is to continue taking Eliquis 5 mg 1 tab twice daily daily for at least 6 months I will see the patient back in 3 months to continue to surveilled his left lower extremity DVT Patient to continue engaging conservative therapy including wearing medical grade 1 compression socks, elevating his legs and remaining active Patient is to seek medical attention immediately at the emergency department if he should start to experience any shortness of breath or chest pain IVC filter removal procedure, risks and benefits were explained to the patient.  All questions were answered.  The patient wishes to move forward.  - VAS [place] LOWER EXTREMITY VENOUS (DVT); Future  2. Bilateral lower extremity edema - Stable As Above  3. Presence of IVC filter - Stable As Above  Current Outpatient Medications on File Prior to Visit  Medication Sig Dispense Refill  . apixaban (ELIQUIS) 2.5 MG TABS tablet Take 1 tablet (2.5 mg total) by mouth 2 (two) times daily. 60 tablet 0  . apixaban (ELIQUIS) 5 MG TABS tablet Take 10 mg PO BID X 6 days and then start 5 mg PO BID (Patient taking differently: Take 5 mg by mouth 2 (two) times daily. ) 90 tablet 1  . Calcium-Magnesium-Zinc (CAL-MAG-ZINC PO) Take 1 tablet by mouth daily.    . COLLAGEN-VITAMIN C PO Take 1 tablet by mouth daily.    . fluconazole (DIFLUCAN) 200 MG tablet Take 4 tablets (800 mg total) by mouth daily. 60 tablet 0  . fluticasone (FLONASE) 50 MCG/ACT nasal spray Place 2 sprays into the nose daily.    Marland Kitchen loratadine (CLARITIN) 10 MG tablet Take 10 mg by mouth at bedtime.    . metoprolol tartrate (LOPRESSOR) 50 MG tablet Take 1 tablet (50 mg total) by mouth 2 (two) times daily. 30 tablet 0  . Multiple Vitamin  (MULTIVITAMIN WITH MINERALS) TABS tablet Take 1 tablet by mouth daily. Centrum Silver    .  Omega-3 Fatty Acids (FISH OIL PO) Take 1 capsule by mouth daily.     . potassium gluconate 595 (99 K) MG TABS tablet Take 595 mg by mouth daily.    Marland Kitchen senna-docusate (SENOKOT-S) 8.6-50 MG tablet Take 1 tablet by mouth daily as needed for mild constipation.    . traMADol (ULTRAM) 50 MG tablet Take 1-2 tablets (50-100 mg total) by mouth every 6 (six) hours. 30 tablet 0  . vitamin B-12 (CYANOCOBALAMIN) 1000 MCG tablet Take 1,000 mcg by mouth daily.    . vitamin C (ASCORBIC ACID) 500 MG tablet Take 500 mg by mouth daily.     No current facility-administered medications on file prior to visit.    There are no Patient Instructions on file for this visit. No follow-ups on file.   A , PA-C

## 2018-07-07 NOTE — Telephone Encounter (Signed)
Patient was seen today and scheduled for a IVC filter removal with Dr. Delana Meyer on 07/20/2018 with a 6:45 am arrival time. Pre-procedure instructions were gone over and patient handed the paperwork.

## 2018-07-09 ENCOUNTER — Encounter: Payer: Medicare HMO | Admitting: Cardiothoracic Surgery

## 2018-07-12 ENCOUNTER — Ambulatory Visit
Admission: RE | Admit: 2018-07-12 | Discharge: 2018-07-12 | Disposition: A | Discharge status: Home / Self Care | Payer: Medicare HMO | Source: Ambulatory Visit | Attending: Cardiothoracic Surgery | Admitting: Cardiothoracic Surgery

## 2018-07-12 ENCOUNTER — Encounter: Payer: Self-pay | Admitting: Cardiothoracic Surgery

## 2018-07-12 ENCOUNTER — Ambulatory Visit (INDEPENDENT_AMBULATORY_CARE_PROVIDER_SITE_OTHER): Payer: Medicare HMO | Admitting: Cardiothoracic Surgery

## 2018-07-12 ENCOUNTER — Ambulatory Visit
Admission: RE | Admit: 2018-07-12 | Discharge: 2018-07-12 | Disposition: A | Discharge status: Home / Self Care | Payer: Medicare HMO | Attending: Cardiothoracic Surgery | Admitting: Cardiothoracic Surgery

## 2018-07-12 ENCOUNTER — Other Ambulatory Visit: Payer: Self-pay

## 2018-07-12 VITALS — BP 147/89 | HR 60 | Temp 97.9°F | Ht 76.0 in | Wt 312.0 lb

## 2018-07-12 DIAGNOSIS — J849 Interstitial pulmonary disease, unspecified: Secondary | ICD-10-CM

## 2018-07-12 DIAGNOSIS — B459 Cryptococcosis, unspecified: Secondary | ICD-10-CM | POA: Diagnosis not present

## 2018-07-12 DIAGNOSIS — J984 Other disorders of lung: Secondary | ICD-10-CM | POA: Diagnosis present

## 2018-07-12 NOTE — Patient Instructions (Signed)
Return as needed.The patient is aware to call back for any questions or concerns.  

## 2018-07-12 NOTE — Progress Notes (Signed)
  He returns today in follow-up.  He has had no new problems.  He did see his primary care physician and Dr. Laural Roes.    He remains on the Diflucan and will remain on that for 6 months.  He does have a further follow-up scheduled with his pulmonologist.  His lungs are clear today.  His heart is regular.  The wounds are healing as expected.  I removed the only stitch.  He did have a chest x-Anirudh made which looks good to my eyes.  I see no evidence of pleural effusion or pneumothorax.  I told him that he could begin to drive as he had no physical limitations at this time.  I did not make a return visit for him but would be happy to see him should the need arise.

## 2018-07-19 ENCOUNTER — Other Ambulatory Visit: Payer: Self-pay

## 2018-07-19 ENCOUNTER — Telehealth (INDEPENDENT_AMBULATORY_CARE_PROVIDER_SITE_OTHER): Payer: Self-pay

## 2018-07-19 MED ORDER — FLUCONAZOLE 200 MG PO TABS: 800.0000 mg | ORAL_TABLET | Freq: Every day | ORAL | 0 refills | Status: DC

## 2018-07-19 MED ORDER — CEFAZOLIN SODIUM-DEXTROSE 2-4 GM/100ML-% IV SOLN
2.0000 g | Freq: Once | INTRAVENOUS | Status: AC
  Administered 2018-07-20: 2 g via INTRAVENOUS

## 2018-07-19 MED ORDER — FLUCONAZOLE 200 MG PO TABS: 800.0000 mg | ORAL_TABLET | Freq: Every day | ORAL | 0 refills | Status: AC

## 2018-07-19 NOTE — Telephone Encounter (Signed)
I have spoken with the patient 3 times today to clarify his arrival time of 6:45 am for his procedure tomorrow with Dr. Delana Meyer. I have also gone over being NPO after midnight, stopping his Eliquis and continuing his antibiotic ( Fluconazole ) for his fungal infection.

## 2018-07-20 ENCOUNTER — Other Ambulatory Visit: Payer: Self-pay | Admitting: Cardiothoracic Surgery

## 2018-07-20 ENCOUNTER — Telehealth: Payer: Self-pay | Admitting: *Deleted

## 2018-07-20 ENCOUNTER — Ambulatory Visit
Admission: RE | Admit: 2018-07-20 | Discharge: 2018-07-20 | Disposition: A | Discharge status: Home / Self Care | Payer: Medicare HMO | Source: Ambulatory Visit | Attending: Vascular Surgery | Admitting: Vascular Surgery

## 2018-07-20 ENCOUNTER — Encounter
Admission: RE | Disposition: A | Discharge status: Home / Self Care | Payer: Self-pay | Source: Ambulatory Visit | Attending: Vascular Surgery

## 2018-07-20 ENCOUNTER — Other Ambulatory Visit: Payer: Self-pay

## 2018-07-20 DIAGNOSIS — Z6838 Body mass index (BMI) 38.0-38.9, adult: Secondary | ICD-10-CM | POA: Diagnosis not present

## 2018-07-20 DIAGNOSIS — I82402 Acute embolism and thrombosis of unspecified deep veins of left lower extremity: Secondary | ICD-10-CM | POA: Diagnosis not present

## 2018-07-20 DIAGNOSIS — E669 Obesity, unspecified: Secondary | ICD-10-CM | POA: Diagnosis not present

## 2018-07-20 DIAGNOSIS — Z7951 Long term (current) use of inhaled steroids: Secondary | ICD-10-CM | POA: Diagnosis not present

## 2018-07-20 DIAGNOSIS — Z79899 Other long term (current) drug therapy: Secondary | ICD-10-CM | POA: Diagnosis not present

## 2018-07-20 DIAGNOSIS — Z7901 Long term (current) use of anticoagulants: Secondary | ICD-10-CM | POA: Diagnosis not present

## 2018-07-20 DIAGNOSIS — R600 Localized edema: Secondary | ICD-10-CM | POA: Diagnosis not present

## 2018-07-20 DIAGNOSIS — I82409 Acute embolism and thrombosis of unspecified deep veins of unspecified lower extremity: Secondary | ICD-10-CM

## 2018-07-20 HISTORY — PX: IVC FILTER REMOVAL: CATH118246

## 2018-07-20 SURGERY — IVC FILTER REMOVAL
Anesthesia: Moderate Sedation

## 2018-07-20 MED ORDER — SODIUM CHLORIDE 0.9 % IV SOLN
INTRAVENOUS | Status: DC
  Administered 2018-07-20: 1000 mL via INTRAVENOUS

## 2018-07-20 MED ORDER — CEFAZOLIN SODIUM-DEXTROSE 2-4 GM/100ML-% IV SOLN
INTRAVENOUS | Status: AC
  Filled 2018-07-20: qty 100

## 2018-07-20 MED ORDER — DIPHENHYDRAMINE HCL 50 MG/ML IJ SOLN: 50.0000 mg | Freq: Once | INTRAMUSCULAR | Status: DC

## 2018-07-20 MED ORDER — FAMOTIDINE 20 MG PO TABS: 40.0000 mg | ORAL_TABLET | ORAL | Status: DC | PRN

## 2018-07-20 MED ORDER — FENTANYL CITRATE (PF) 100 MCG/2ML IJ SOLN
INTRAMUSCULAR | Status: DC | PRN
  Administered 2018-07-20 (×2): 50 ug via INTRAVENOUS

## 2018-07-20 MED ORDER — METHYLPREDNISOLONE SODIUM SUCC 125 MG IJ SOLR: 125.0000 mg | INTRAMUSCULAR | Status: DC | PRN

## 2018-07-20 MED ORDER — FENTANYL CITRATE (PF) 100 MCG/2ML IJ SOLN
INTRAMUSCULAR | Status: AC
  Filled 2018-07-20: qty 2

## 2018-07-20 MED ORDER — MIDAZOLAM HCL 2 MG/ML PO SYRP: 8.0000 mg | ORAL_SOLUTION | Freq: Once | ORAL | Status: DC | PRN

## 2018-07-20 MED ORDER — MIDAZOLAM HCL 5 MG/5ML IJ SOLN
INTRAMUSCULAR | Status: AC
  Filled 2018-07-20: qty 5

## 2018-07-20 MED ORDER — MIDAZOLAM HCL 2 MG/2ML IJ SOLN
INTRAMUSCULAR | Status: DC | PRN
  Administered 2018-07-20: 2 mg via INTRAVENOUS
  Administered 2018-07-20: 1 mg via INTRAVENOUS

## 2018-07-20 SURGICAL SUPPLY — 3 items
PACK ANGIOGRAPHY (CUSTOM PROCEDURE TRAY) ×2 IMPLANT
SET VENACAVA FILTER RETRIEVAL (MISCELLANEOUS) ×2 IMPLANT
WIRE J 3MM .035X145CM (WIRE) ×2 IMPLANT

## 2018-07-20 NOTE — Telephone Encounter (Signed)
Did Dr Genevive Bi ask him to stop this medication, unclear, he hasn't been seen since last week. A procedure completed by Dr Delana Meyer?

## 2018-07-20 NOTE — Telephone Encounter (Signed)
He is calling asking if he needs to continue his Metoprolol or not, he has run out and will need a refill if this is to be continued? thanks

## 2018-07-20 NOTE — H&P (Signed)
[facility] History & Physical Update  The patient was interviewed and re-examined.  The patient's previous History and Physical has been reviewed and is unchanged.  There is no change in the plan of care. We plan to proceed with the scheduled procedure.  Hortencia Pilar, MD  07/20/2018, 8:12 AM

## 2018-07-20 NOTE — Progress Notes (Signed)
Dr. Delana Meyer in at bedside to speak with pt. Re: procedure. Pt. Verbalizes understanding of conversation.

## 2018-07-20 NOTE — Telephone Encounter (Signed)
Patient call and wants a call back in regards to taking metoprolol. Does he still need to continue.

## 2018-07-20 NOTE — Discharge Instructions (Signed)

## 2018-07-20 NOTE — Op Note (Signed)
  OPERATIVE NOTE   PRE-OPERATIVE DIAGNOSIS: DVT   POST-OPERATIVE DIAGNOSIS: Same  PROCEDURE: 1. Inferior Vena Cavagram  SURGEON: Katha Cabal, M.D.  ANESTHESIA:  Conscious sedation was administered under my direct supervision by the interventional radiology RN. IV Versed plus fentanyl were utilized. Continuous ECG, pulse oximetry and blood pressure was monitored throughout the entire procedure. Conscious sedation was for a total of 15 minutes.  ESTIMATED BLOOD LOSS: Minimal cc  FINDING(S):inferior vena cava is widely patent filter is in place in good position. Filter is removed without incident  SPECIMEN(S):  IVC filter intact  INDICATIONS:   John Jenkins is a 68 y.o. male who presents with DVT. The patient has now tolerated anticoagulation for several months. Oral anticoagulation will be continued.  DESCRIPTION: After obtaining full informed written consent, the patient was brought back to the Special Procedure Suite and placed in the supine position.  The patient received IV antibiotics prior to induction.  After obtaining adequate sedation, the patient was prepped and draped in the standard fashion and appropriate time out is called.     Ultrasound was placed in a sterile sleeve.The right neck was then imaged with ultrasound.   Jugular vein was identified it is echolucent and homogeneous indicating patency. 1% lidocaine is then infiltrated under ultrasound visualization and subsequently a Seldinger needle is inserted under real-time ultrasound guidance.  J-wire is then advanced into the inferior vena cava under fluoroscopic guidance. With the tip of the sheath positioned in the proximal left external iliac vein imaging of the left iliac veins and inferior vena caval imaging is performed.  Sheath is removed by pressures held the patient tolerated the procedure well and there were no immediate complications.  Interpretation: inferior vena cava is widely patent, previously placed  Venovo stent within the left common iliac vein is also widely patent and free of any in-stent restenosis.  Proximal external iliac vein is also widely patent.   COMPLICATIONS: None  CONDITION: John Jenkins, M.D. [facility] Office: [phone]   07/20/2018, 8:56 AM

## 2018-07-21 NOTE — Telephone Encounter (Signed)
Dr Genevive Bi recommended him follow up with his primary care Dr Netty Starring and he did not feel he needed a refill on the medication, that it was an episodic flare while in hospital, pt agrees to call PCP.

## 2018-07-23 ENCOUNTER — Telehealth (INDEPENDENT_AMBULATORY_CARE_PROVIDER_SITE_OTHER): Payer: Self-pay | Admitting: Vascular Surgery

## 2018-07-25 ENCOUNTER — Other Ambulatory Visit: Payer: Self-pay | Admitting: Cardiothoracic Surgery

## 2018-07-29 ENCOUNTER — Other Ambulatory Visit: Payer: Self-pay

## 2018-07-29 ENCOUNTER — Encounter: Payer: Self-pay | Admitting: Vascular Surgery

## 2018-07-29 NOTE — Progress Notes (Signed)
Patient needs a new prescription for his Diflucan for a one month supply. His current prescription is only for 15 days worth and he will be on this medication for the next 5 months.  Message to Dr Genevive Bi about this.

## 2018-07-29 NOTE — Progress Notes (Signed)
Per Dr Genevive Bi this prescription would need to be done either by the patient's Pulmonary provider or his Infectious disease doctor. Message left for the patient about this.

## 2018-07-30 LAB — FUNGUS CULTURE WITH STAIN

## 2018-07-30 LAB — FUNGUS CULTURE RESULT

## 2018-07-30 LAB — FUNGAL ORGANISM REFLEX

## 2018-08-12 LAB — ACID FAST CULTURE WITH REFLEXED SENSITIVITIES (MYCOBACTERIA): Acid Fast Culture: NEGATIVE

## 2018-08-18 LAB — FUNGUS CULTURE, BLOOD: Culture: NO GROWTH

## 2018-09-09 ENCOUNTER — Other Ambulatory Visit (INDEPENDENT_AMBULATORY_CARE_PROVIDER_SITE_OTHER): Payer: Self-pay

## 2018-09-09 MED ORDER — APIXABAN 5 MG PO TABS: ORAL_TABLET | ORAL | 5 refills | Status: AC

## 2018-09-14 ENCOUNTER — Other Ambulatory Visit (INDEPENDENT_AMBULATORY_CARE_PROVIDER_SITE_OTHER): Payer: Self-pay | Admitting: Nurse Practitioner

## 2018-09-14 MED ORDER — APIXABAN 5 MG PO TABS: 5.0000 mg | ORAL_TABLET | Freq: Two times a day (BID) | ORAL | 3 refills | Status: AC

## 2018-09-16 NOTE — Telephone Encounter (Signed)
Patient left message on nurse line Wednesday 09/15/2018 requesting refill for Eliquis 2.5mg  bid. Patient state he has contacted pharmacy several times and pharmacy says we have not given auth for refill.  I have called [facility] to give a verbal order for Eliquis 5mg  BID-30 day supply with 3 refill. I see where we have tried to send order electronically for 5mg  but it never went through. Spoke with April, she said she talked with provider and that we are sending 5mg  in lue of the 2.5mg .  AS, CMA

## 2018-10-01 ENCOUNTER — Other Ambulatory Visit (INDEPENDENT_AMBULATORY_CARE_PROVIDER_SITE_OTHER): Payer: Self-pay | Admitting: Nurse Practitioner

## 2018-10-05 DIAGNOSIS — R238 Other skin changes: Secondary | ICD-10-CM | POA: Diagnosis not present

## 2018-10-05 DIAGNOSIS — W57XXXA Bitten or stung by nonvenomous insect and other nonvenomous arthropods, initial encounter: Secondary | ICD-10-CM | POA: Diagnosis not present

## 2018-10-06 ENCOUNTER — Encounter (INDEPENDENT_AMBULATORY_CARE_PROVIDER_SITE_OTHER): Payer: Medicare HMO

## 2018-10-06 ENCOUNTER — Ambulatory Visit (INDEPENDENT_AMBULATORY_CARE_PROVIDER_SITE_OTHER): Payer: Medicare HMO | Admitting: Vascular Surgery

## 2018-10-12 ENCOUNTER — Ambulatory Visit (INDEPENDENT_AMBULATORY_CARE_PROVIDER_SITE_OTHER): Payer: Medicare HMO

## 2018-10-12 ENCOUNTER — Other Ambulatory Visit: Payer: Self-pay

## 2018-10-12 ENCOUNTER — Telehealth (INDEPENDENT_AMBULATORY_CARE_PROVIDER_SITE_OTHER): Payer: Self-pay | Admitting: Nurse Practitioner

## 2018-10-12 ENCOUNTER — Encounter (INDEPENDENT_AMBULATORY_CARE_PROVIDER_SITE_OTHER): Payer: Self-pay | Admitting: Nurse Practitioner

## 2018-10-12 ENCOUNTER — Ambulatory Visit (INDEPENDENT_AMBULATORY_CARE_PROVIDER_SITE_OTHER): Payer: Medicare HMO | Admitting: Nurse Practitioner

## 2018-10-12 VITALS — BP 133/73 | HR 72 | Resp 16 | Wt 317.6 lb

## 2018-10-12 DIAGNOSIS — I82532 Chronic embolism and thrombosis of left popliteal vein: Secondary | ICD-10-CM

## 2018-10-12 DIAGNOSIS — I82512 Chronic embolism and thrombosis of left femoral vein: Secondary | ICD-10-CM

## 2018-10-12 DIAGNOSIS — Z7901 Long term (current) use of anticoagulants: Secondary | ICD-10-CM

## 2018-10-12 DIAGNOSIS — Z79899 Other long term (current) drug therapy: Secondary | ICD-10-CM

## 2018-10-12 DIAGNOSIS — R600 Localized edema: Secondary | ICD-10-CM | POA: Diagnosis not present

## 2018-10-12 DIAGNOSIS — I82402 Acute embolism and thrombosis of unspecified deep veins of left lower extremity: Secondary | ICD-10-CM

## 2018-10-12 DIAGNOSIS — E7800 Pure hypercholesterolemia, unspecified: Secondary | ICD-10-CM | POA: Diagnosis not present

## 2018-10-12 NOTE — Progress Notes (Signed)
SUBJECTIVE:  Patient ID: John Jenkins, male    DOB: 1951-03-05, 68 y.o.   MRN: [id number] Chief Complaint  Patient presents with  . Follow-up    53month ultrasound follow up    HPI  John Jenkins is a 68 y.o. male the presents today for noninvasive studies regarding his left lower extremity DVT.  The patient originally underwent thrombectomy on 05/04/2018 with IVC filter placement.  He then had his IVC filter removed on 07/20/2018.  Since that time the patient has had no issues currently.  He has no issues with his anticoagulation.  The swelling has been well controlled.  He utilizes medical grade 1 compression stockings on a daily basis.  He also is aggressive about vein his lower extremities as well as exercising on a daily basis.  Overall the patient seems to be doing well.  Today noninvasive studies show that the patient has findings consistent with chronic DVT involving the left common femoral vein, superficial femoral vein and popliteal veins.  The posterior tibial veins and peroneal veins appear compressible today.  There is also some recannulization in the area of the superficial femoral vein and popliteal veins which are consistent with the previous exam on 07/07/2018.  Past Medical History:  Diagnosis Date  . DVT (deep venous thrombosis) ([place]) 2019  . Obese   . Pneumonia   . Seasonal allergies     Past Surgical History:  Procedure Laterality Date  . ANKLE SURGERY  1974  . broken arm  1970  . IVC FILTER REMOVAL N/A 07/20/2018   Procedure: IVC FILTER REMOVAL;  Surgeon: Katha Cabal, MD;  Location: [facility];  Service: Cardiovascular;  Laterality: N/A;  . PERIPHERAL VASCULAR THROMBECTOMY Left 05/04/2018   Procedure: PERIPHERAL VASCULAR THROMBECTOMY;  Surgeon: Katha Cabal, MD;  Location: [facility];  Service: Cardiovascular;  Laterality: Left;  Marland Kitchen VIDEO ASSISTED THORACOSCOPY (VATS)/THOROCOTOMY Left 06/30/2018   Procedure: VIDEO ASSISTED THORACOSCOPY  (VATS)/THOROCOTOMY WITH LUNG BIOPSY-LEFT  PRE-OP BRONCH;  Surgeon: Nestor Lewandowsky, MD;  Location: [facility];  Service: General;  Laterality: Left;    Social History   Socioeconomic History  . Marital status: Single    Spouse name: Not on file  . Number of children: Not on file  . Years of education: Not on file  . Highest education level: Not on file  Occupational History  . Not on file  Social Needs  . Financial resource strain: Not on file  . Food insecurity:    Worry: Not on file    Inability: Not on file  . Transportation needs:    Medical: Not on file    Non-medical: Not on file  Tobacco Use  . Smoking status: Never Smoker  . Smokeless tobacco: Never Used  Substance and Sexual Activity  . Alcohol use: Never    Frequency: Never  . Drug use: Never  . Sexual activity: Not on file  Lifestyle  . Physical activity:    Days per week: Not on file    Minutes per session: Not on file  . Stress: Not on file  Relationships  . Social connections:    Talks on phone: Not on file    Gets together: Not on file    Attends religious service: Not on file    Active member of club or organization: Not on file    Attends meetings of clubs or organizations: Not on file    Relationship status: Not on file  . Intimate  partner violence:    Fear of current or ex partner: Not on file    Emotionally abused: Not on file    Physically abused: Not on file    Forced sexual activity: Not on file  Other Topics Concern  . Not on file  Social History Narrative  . Not on file    Family History  Problem Relation Age of Onset  . Heart disease Mother   . Heart attack Father     No Known Allergies   Review of Systems   Review of Systems: Negative Unless Checked Constitutional: [] Weight loss  [] Fever  [] Chills Cardiac: [] Chest pain   []  Atrial Fibrillation  [] Palpitations   [] Shortness of breath when laying flat   [] Shortness of breath with exertion. [] Shortness of breath at rest Vascular:   [] Pain in legs with walking   [] Pain in legs with standing [] Pain in legs when laying flat   [] Claudication    [] Pain in feet when laying flat    [] History of DVT   [] Phlebitis   [x] Swelling in legs   [] Varicose veins   [] Non-healing ulcers Pulmonary:   [] Uses home oxygen   [] Productive cough   [] Hemoptysis   [] Wheeze  [] COPD   [] Asthma Neurologic:  [] Dizziness   [] Seizures  [] Blackouts [] History of stroke   [] History of TIA  [] Aphasia   [] Temporary Blindness   [] Weakness or numbness in arm   [] Weakness or numbness in leg Musculoskeletal:   [] Joint swelling   [] Joint pain   [] Low back pain  []  History of Knee Replacement [] Arthritis [] back Surgeries  []  Spinal Stenosis    Hematologic:  [] Easy bruising  [] Easy bleeding   [] Hypercoagulable state   [] Anemic Gastrointestinal:  [] Diarrhea   [] Vomiting  [] Gastroesophageal reflux/heartburn   [] Difficulty swallowing. [] Abdominal pain Genitourinary:  [] Chronic kidney disease   [] Difficult urination  [] Anuric   [] Blood in urine [] Frequent urination  [] Burning with urination   [] Hematuria Skin:  [] Rashes   [] Ulcers [] Wounds Psychological:  [] History of anxiety   []  History of major depression  []  Memory Difficulties      OBJECTIVE:   Physical Exam  BP 133/73 (BP Location: Left Arm)   Pulse 72   Resp 16   Wt (!) 317 lb 9.6 oz (144.1 kg)   BMI 38.66 kg/m   Gen: WD/WN, NAD Head: [place]/AT, No temporalis wasting.  Ear/Nose/Throat: Hearing grossly intact, nares w/o erythema or drainage Eyes: PER, EOMI, sclera nonicteric.  Neck: Supple, no masses.  No JVD.  Pulmonary:  Good air movement, no use of accessory muscles.  Cardiac: RRR Vascular:  No swelling present on examination today Vessel Right Left  Radial Palpable Palpable  Dorsalis Pedis Palpable Palpable  Posterior Tibial Palpable Palpable   Gastrointestinal: soft, non-distended. No guarding/no peritoneal signs.  Musculoskeletal: M/S 5/5 throughout.  No deformity or atrophy.  Neurologic: Pain and  light touch intact in extremities.  Symmetrical.  Speech is fluent. Motor exam as listed above. Psychiatric: Judgment intact, Mood & affect appropriate for pt's clinical situation. Dermatologic:  Stasis dermatitis bilaterally no Ulcers Noted.  No changes consistent with cellulitis. Lymph : No Cervical lymphadenopathy, no lichenification or skin changes of chronic lymphedema.       ASSESSMENT AND PLAN:  1. Deep vein thrombosis (DVT) of left lower extremity, unspecified chronicity, unspecified vein (HCC) Patient has evidence of a chronic DVT in his left lower extremity.  At this time he has excellent control of his postphlebitic symptoms.  We will have the patient follow-up  in a year in order to examine the progression of his chronic DVT.  The patient is advised to follow-up sooner if he is concerned that he may exhibit signs symptoms of DVT. - VAS [place] LOWER EXTREMITY VENOUS (DVT); Future  2. Pure hypercholesterolemia Continue statin as ordered and reviewed, no changes at this time   3. Bilateral lower extremity edema Patient utilizes medical grade 1 compression stockings on a regular basis and uses excellent conservative therapy.  Patient is advised to continue to do so to control postphlebitic symptoms.   Current Outpatient Medications on File Prior to Visit  Medication Sig Dispense Refill  . apixaban (ELIQUIS) 5 MG TABS tablet Take 5mg  daily 90 tablet 5  . apixaban (ELIQUIS) 5 MG TABS tablet Take 1 tablet (5 mg total) by mouth 2 (two) times daily. 60 tablet 3  . Calcium-Magnesium-Zinc (CAL-MAG-ZINC PO) Take 1 tablet by mouth daily.    . COLLAGEN-VITAMIN C PO Take 1 tablet by mouth daily.    . fluconazole (DIFLUCAN) 200 MG tablet Take 4 tablets (800 mg total) by mouth daily. 60 tablet 0  . fluticasone (FLONASE) 50 MCG/ACT nasal spray Place 2 sprays into the nose daily.    Marland Kitchen loratadine (CLARITIN) 10 MG tablet Take 10 mg by mouth at bedtime.    . Multiple Vitamin (MULTIVITAMIN WITH  MINERALS) TABS tablet Take 1 tablet by mouth daily. Centrum Silver    . Omega-3 Fatty Acids (FISH OIL PO) Take 1 capsule by mouth daily.     . potassium gluconate 595 (99 K) MG TABS tablet Take 595 mg by mouth daily.    . vitamin B-12 (CYANOCOBALAMIN) 1000 MCG tablet Take 1,000 mcg by mouth daily.    . vitamin C (ASCORBIC ACID) 500 MG tablet Take 500 mg by mouth daily.    . metoprolol tartrate (LOPRESSOR) 50 MG tablet Take 1 tablet (50 mg total) by mouth 2 (two) times daily. (Patient not taking: Reported on 10/12/2018) 30 tablet 0  . senna-docusate (SENOKOT-S) 8.6-50 MG tablet Take 1 tablet by mouth daily as needed for mild constipation.    . traMADol (ULTRAM) 50 MG tablet Take 1-2 tablets (50-100 mg total) by mouth every 6 (six) hours. (Patient not taking: Reported on 10/12/2018) 30 tablet 0   No current facility-administered medications on file prior to visit.     There are no Patient Instructions on file for this visit. Return in about 1 year (around 10/12/2019) for DVT.   Kris Hartmann, NP  This note was completed with Sales executive.  Any errors are purely unintentional.

## 2018-10-12 NOTE — Telephone Encounter (Signed)
He should continue his eliquis at least until July 1st in regards to his DVT, however  I was looking into some of his history and he had an episode of atrial fibrillation following a procedure.  I would talk with Dr. Netty Starring if he wants him to continue the eliquis due to the episode of atrial fibrillation.

## 2018-10-12 NOTE — Telephone Encounter (Signed)
Patient is aware of the below and verbalized understanding. AS, CMA 

## 2018-10-12 NOTE — Telephone Encounter (Signed)
Patient was in the office this morning and forgot to ask a question:  Is he supposed to continue Eliquis and if so how long.   Please advise. AS, CMA

## 2018-11-02 DIAGNOSIS — I8290 Acute embolism and thrombosis of unspecified vein: Secondary | ICD-10-CM | POA: Diagnosis not present

## 2018-11-02 DIAGNOSIS — B450 Pulmonary cryptococcosis: Secondary | ICD-10-CM | POA: Diagnosis not present

## 2018-11-02 DIAGNOSIS — E669 Obesity, unspecified: Secondary | ICD-10-CM | POA: Diagnosis not present

## 2018-11-02 DIAGNOSIS — Z1389 Encounter for screening for other disorder: Secondary | ICD-10-CM | POA: Diagnosis not present

## 2018-11-11 ENCOUNTER — Other Ambulatory Visit: Payer: Self-pay | Admitting: Cardiothoracic Surgery

## 2018-11-23 ENCOUNTER — Other Ambulatory Visit (HOSPITAL_COMMUNITY): Payer: Self-pay | Admitting: Pulmonary Disease

## 2018-11-23 ENCOUNTER — Other Ambulatory Visit: Payer: Self-pay | Admitting: Pulmonary Disease

## 2018-11-23 DIAGNOSIS — Z1159 Encounter for screening for other viral diseases: Secondary | ICD-10-CM | POA: Diagnosis not present

## 2018-11-23 DIAGNOSIS — B450 Pulmonary cryptococcosis: Secondary | ICD-10-CM | POA: Diagnosis not present

## 2018-11-29 DIAGNOSIS — R918 Other nonspecific abnormal finding of lung field: Secondary | ICD-10-CM | POA: Diagnosis not present

## 2018-12-29 ENCOUNTER — Other Ambulatory Visit: Payer: Self-pay

## 2018-12-29 ENCOUNTER — Ambulatory Visit
Admission: RE | Admit: 2018-12-29 | Discharge: 2018-12-29 | Disposition: A | Discharge status: Home / Self Care | Payer: Medicare HMO | Source: Ambulatory Visit | Attending: Pulmonary Disease | Admitting: Pulmonary Disease

## 2018-12-29 DIAGNOSIS — R918 Other nonspecific abnormal finding of lung field: Secondary | ICD-10-CM | POA: Diagnosis not present

## 2018-12-29 DIAGNOSIS — B450 Pulmonary cryptococcosis: Secondary | ICD-10-CM | POA: Insufficient documentation

## 2018-12-29 MED ORDER — IOHEXOL 300 MG/ML  SOLN
75.0000 mL | Freq: Once | INTRAMUSCULAR | Status: AC | PRN
  Administered 2018-12-29: 75 mL via INTRAVENOUS

## 2019-01-04 DIAGNOSIS — B459 Cryptococcosis, unspecified: Secondary | ICD-10-CM | POA: Diagnosis not present

## 2019-01-04 DIAGNOSIS — B450 Pulmonary cryptococcosis: Secondary | ICD-10-CM | POA: Diagnosis not present

## 2019-01-10 DIAGNOSIS — R05 Cough: Secondary | ICD-10-CM | POA: Diagnosis not present

## 2019-01-10 DIAGNOSIS — R0600 Dyspnea, unspecified: Secondary | ICD-10-CM | POA: Diagnosis not present

## 2019-01-10 DIAGNOSIS — J029 Acute pharyngitis, unspecified: Secondary | ICD-10-CM | POA: Diagnosis not present

## 2019-01-10 DIAGNOSIS — B450 Pulmonary cryptococcosis: Secondary | ICD-10-CM | POA: Diagnosis not present

## 2019-01-14 DIAGNOSIS — R05 Cough: Secondary | ICD-10-CM | POA: Diagnosis not present

## 2019-01-14 DIAGNOSIS — J029 Acute pharyngitis, unspecified: Secondary | ICD-10-CM | POA: Diagnosis not present

## 2019-01-27 ENCOUNTER — Telehealth (INDEPENDENT_AMBULATORY_CARE_PROVIDER_SITE_OTHER): Payer: Self-pay

## 2019-01-27 NOTE — Telephone Encounter (Signed)
Patient called asking should he continue taking Elquis 2.5mg  twice a day or should he restart taking 5mg  twice a day since he has stop taking Fluconazole.I spoke with John Ditch NP and she advise the patient can restart taking 5mg  twice a day as long he has been off Fluconazole for 2weeks. I left a detail message on the patient voicemail with John Ditch NP advice.

## 2019-01-31 DIAGNOSIS — Z1389 Encounter for screening for other disorder: Secondary | ICD-10-CM | POA: Diagnosis not present

## 2019-01-31 DIAGNOSIS — Z0000 Encounter for general adult medical examination without abnormal findings: Secondary | ICD-10-CM | POA: Diagnosis not present

## 2019-01-31 DIAGNOSIS — I8290 Acute embolism and thrombosis of unspecified vein: Secondary | ICD-10-CM | POA: Diagnosis not present

## 2019-01-31 DIAGNOSIS — Z23 Encounter for immunization: Secondary | ICD-10-CM | POA: Diagnosis not present

## 2019-01-31 DIAGNOSIS — E669 Obesity, unspecified: Secondary | ICD-10-CM | POA: Diagnosis not present

## 2019-01-31 DIAGNOSIS — H6121 Impacted cerumen, right ear: Secondary | ICD-10-CM | POA: Diagnosis not present

## 2019-02-09 DIAGNOSIS — E7800 Pure hypercholesterolemia, unspecified: Secondary | ICD-10-CM | POA: Diagnosis not present

## 2019-02-09 DIAGNOSIS — Z23 Encounter for immunization: Secondary | ICD-10-CM | POA: Diagnosis not present

## 2019-02-09 DIAGNOSIS — I825Y2 Chronic embolism and thrombosis of unspecified deep veins of left proximal lower extremity: Secondary | ICD-10-CM | POA: Diagnosis not present

## 2019-02-09 DIAGNOSIS — I10 Essential (primary) hypertension: Secondary | ICD-10-CM | POA: Diagnosis not present

## 2019-02-09 DIAGNOSIS — Z6841 Body Mass Index (BMI) 40.0 and over, adult: Secondary | ICD-10-CM | POA: Diagnosis not present

## 2019-02-09 DIAGNOSIS — J849 Interstitial pulmonary disease, unspecified: Secondary | ICD-10-CM | POA: Diagnosis not present

## 2019-02-22 DIAGNOSIS — E669 Obesity, unspecified: Secondary | ICD-10-CM | POA: Diagnosis not present

## 2019-02-22 DIAGNOSIS — I8290 Acute embolism and thrombosis of unspecified vein: Secondary | ICD-10-CM | POA: Diagnosis not present

## 2019-03-29 DIAGNOSIS — E669 Obesity, unspecified: Secondary | ICD-10-CM | POA: Diagnosis not present

## 2019-03-29 DIAGNOSIS — I8290 Acute embolism and thrombosis of unspecified vein: Secondary | ICD-10-CM | POA: Diagnosis not present

## 2019-03-30 ENCOUNTER — Other Ambulatory Visit (INDEPENDENT_AMBULATORY_CARE_PROVIDER_SITE_OTHER): Payer: Self-pay | Admitting: Nurse Practitioner

## 2019-03-30 DIAGNOSIS — Z86010 Personal history of colonic polyps: Secondary | ICD-10-CM | POA: Diagnosis not present

## 2019-04-26 IMAGING — DX DG CHEST 1V PORT
1 series · 1 of 1 positions shown · non-contrast
Comparison: 06/22/2018 and earlier, including PET-CT 06/09/2018 and
chest CT 05/26/2018.

CLINICAL DATA: Postop day 0 LEFT VATS and LEFT lung biopsy.

EXAM:
PORTABLE CHEST 1 VIEW

[chest ap]
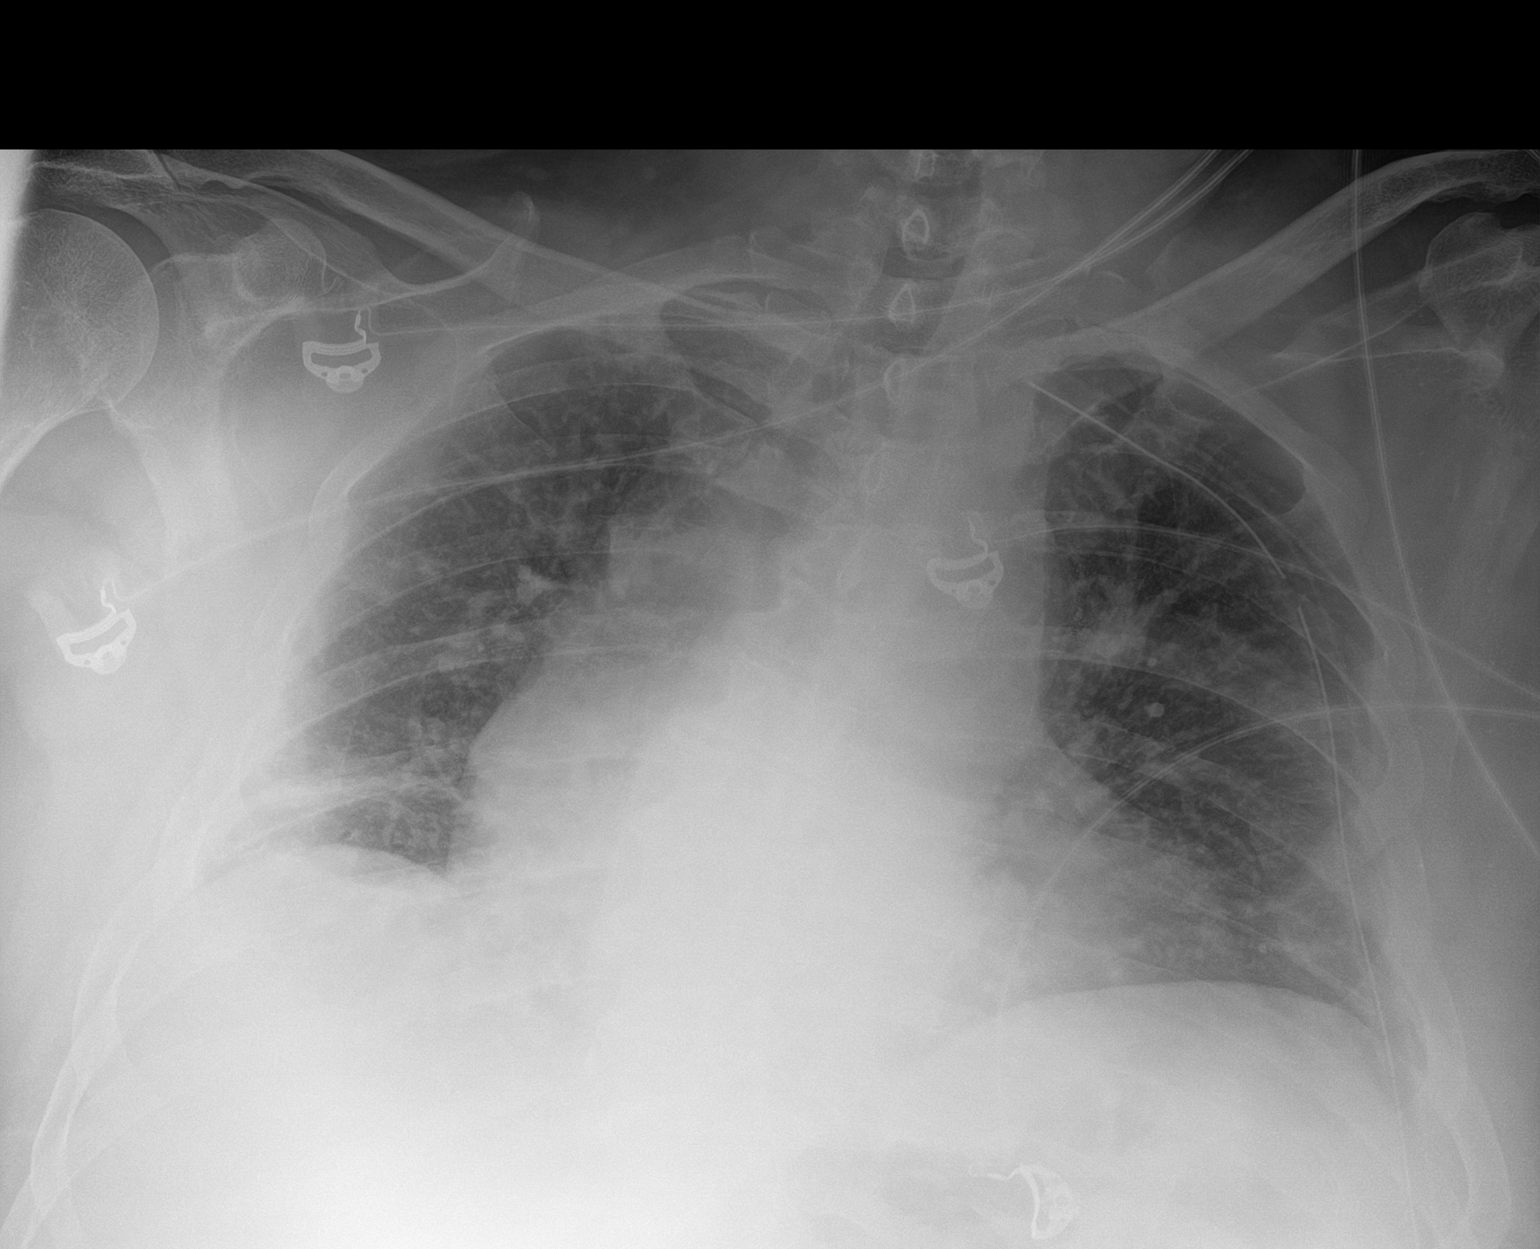

[1 of 1 positions shown; findings below may reference images not displayed]

FINDINGS: Cardiac silhouette upper normal in size to perhaps slightly enlarged
for AP portable technique. LEFT chest tube in place with no
pneumothorax. Mild linear atelectasis at the lung bases. Pulmonary
venous hypertension without overt edema.
IMPRESSION: LEFT chest tube in place with no pneumothorax. Mild bibasilar
atelectasis. Pulmonary venous hypertension without overt edema.

## 2019-04-27 IMAGING — CR DG CHEST 2V
1 series · 3 of 3 positions shown · non-contrast
Comparison: 06/30/2018 and older exams.

CLINICAL DATA: Postop check.  Status post left thoracoscopy.

EXAM:
CHEST - 2 VIEW

[Series 1: x chest ap · 0.14mm/px · 3 of 3 slices shown]
[im 1/3]
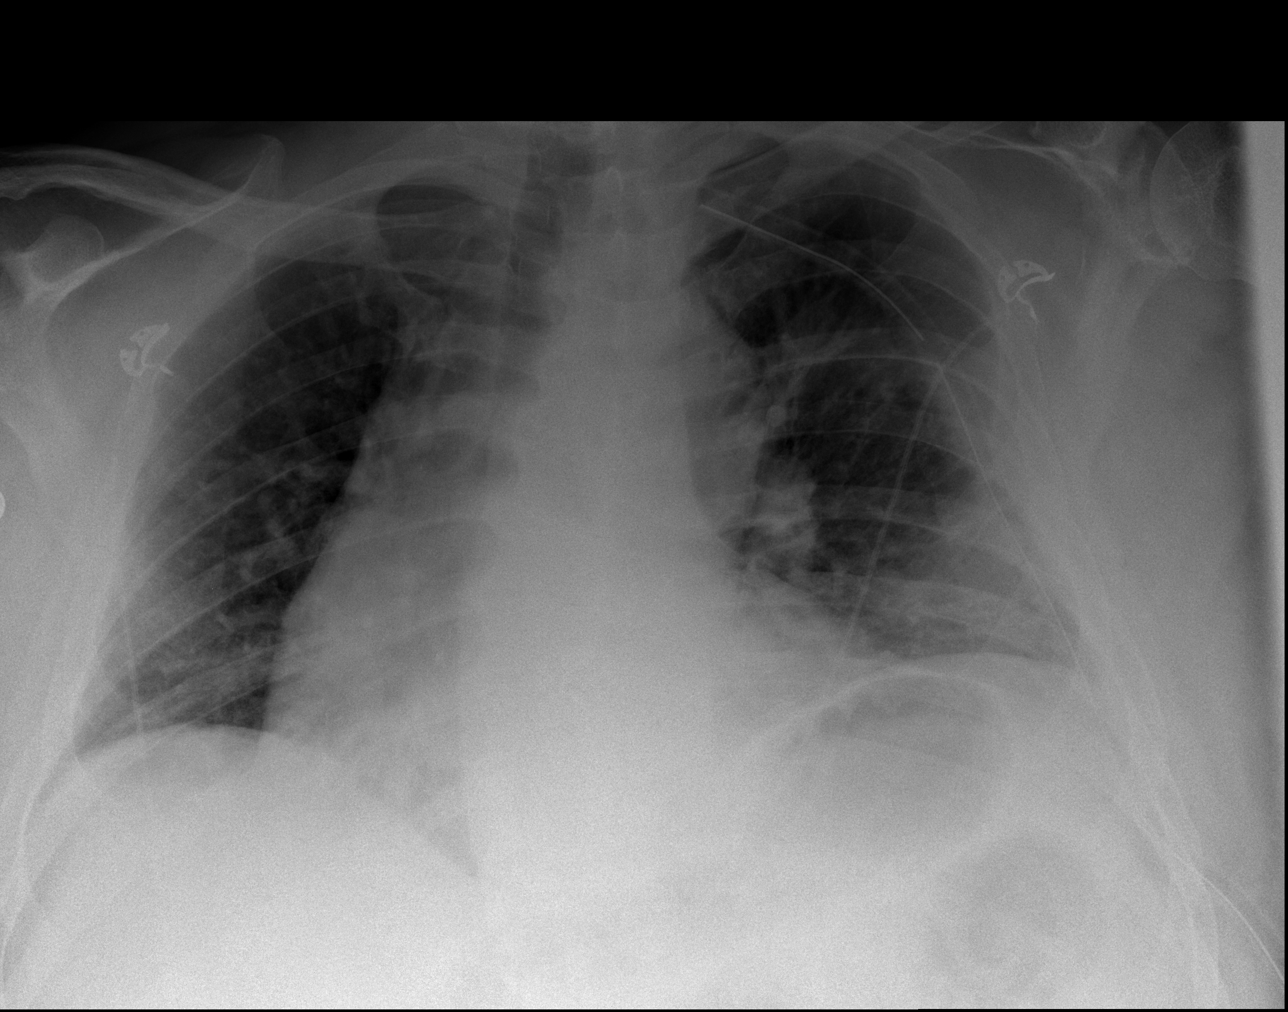
[im 2/3]
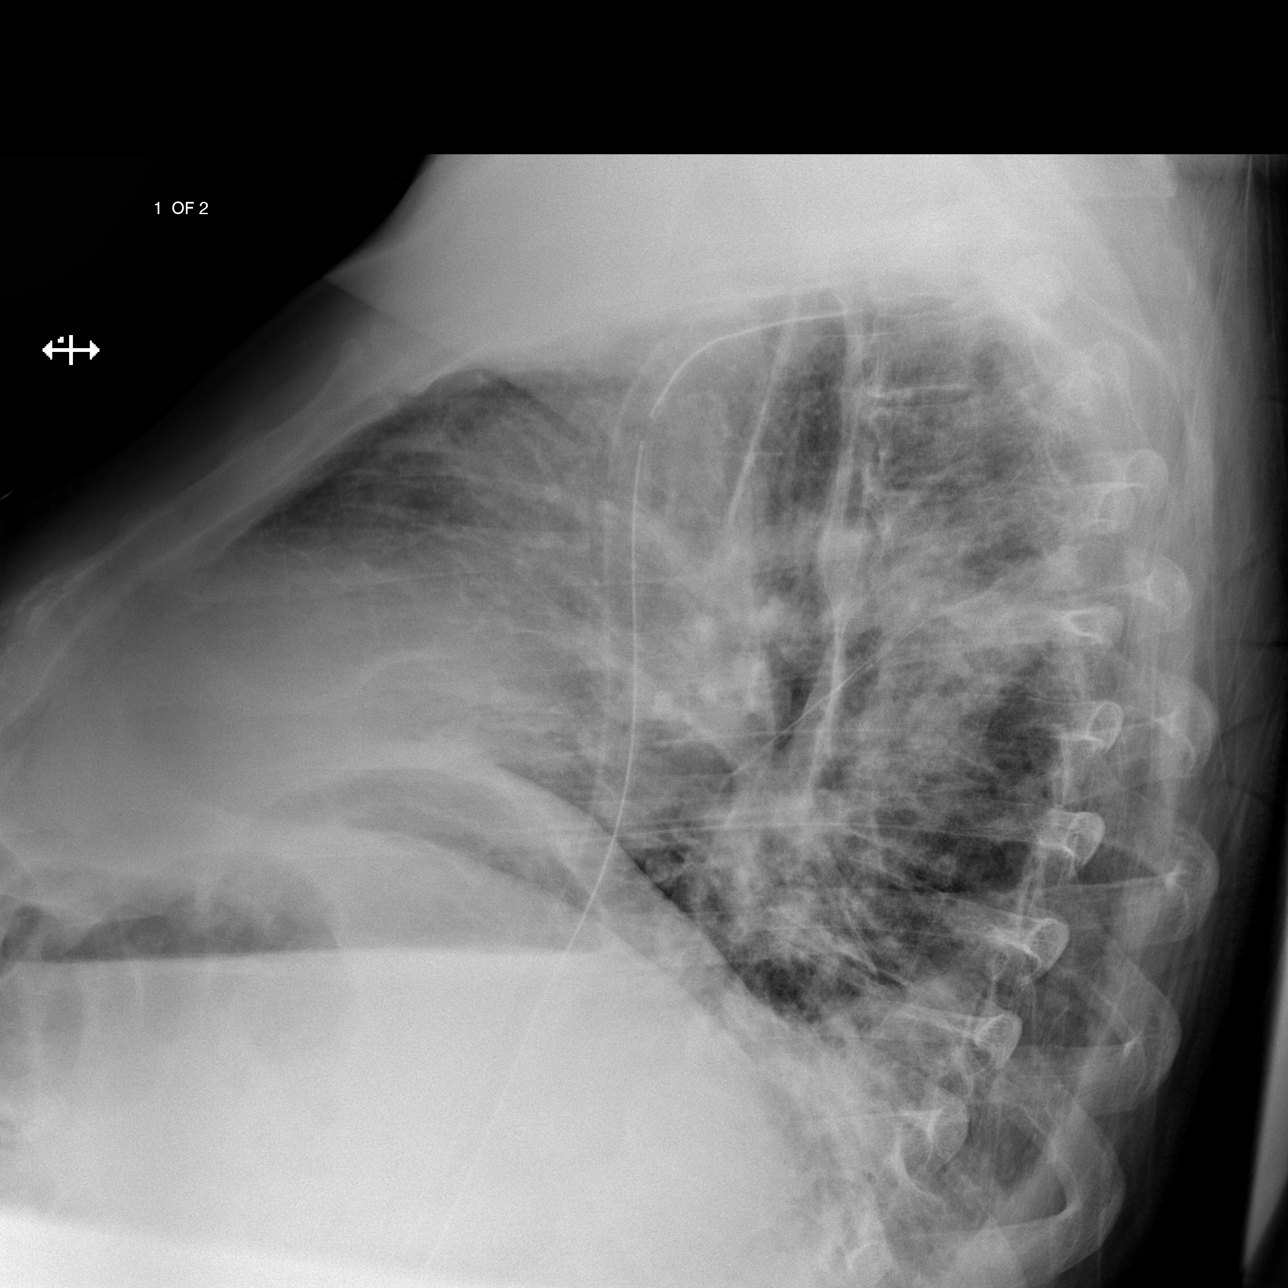
[im 3/3]
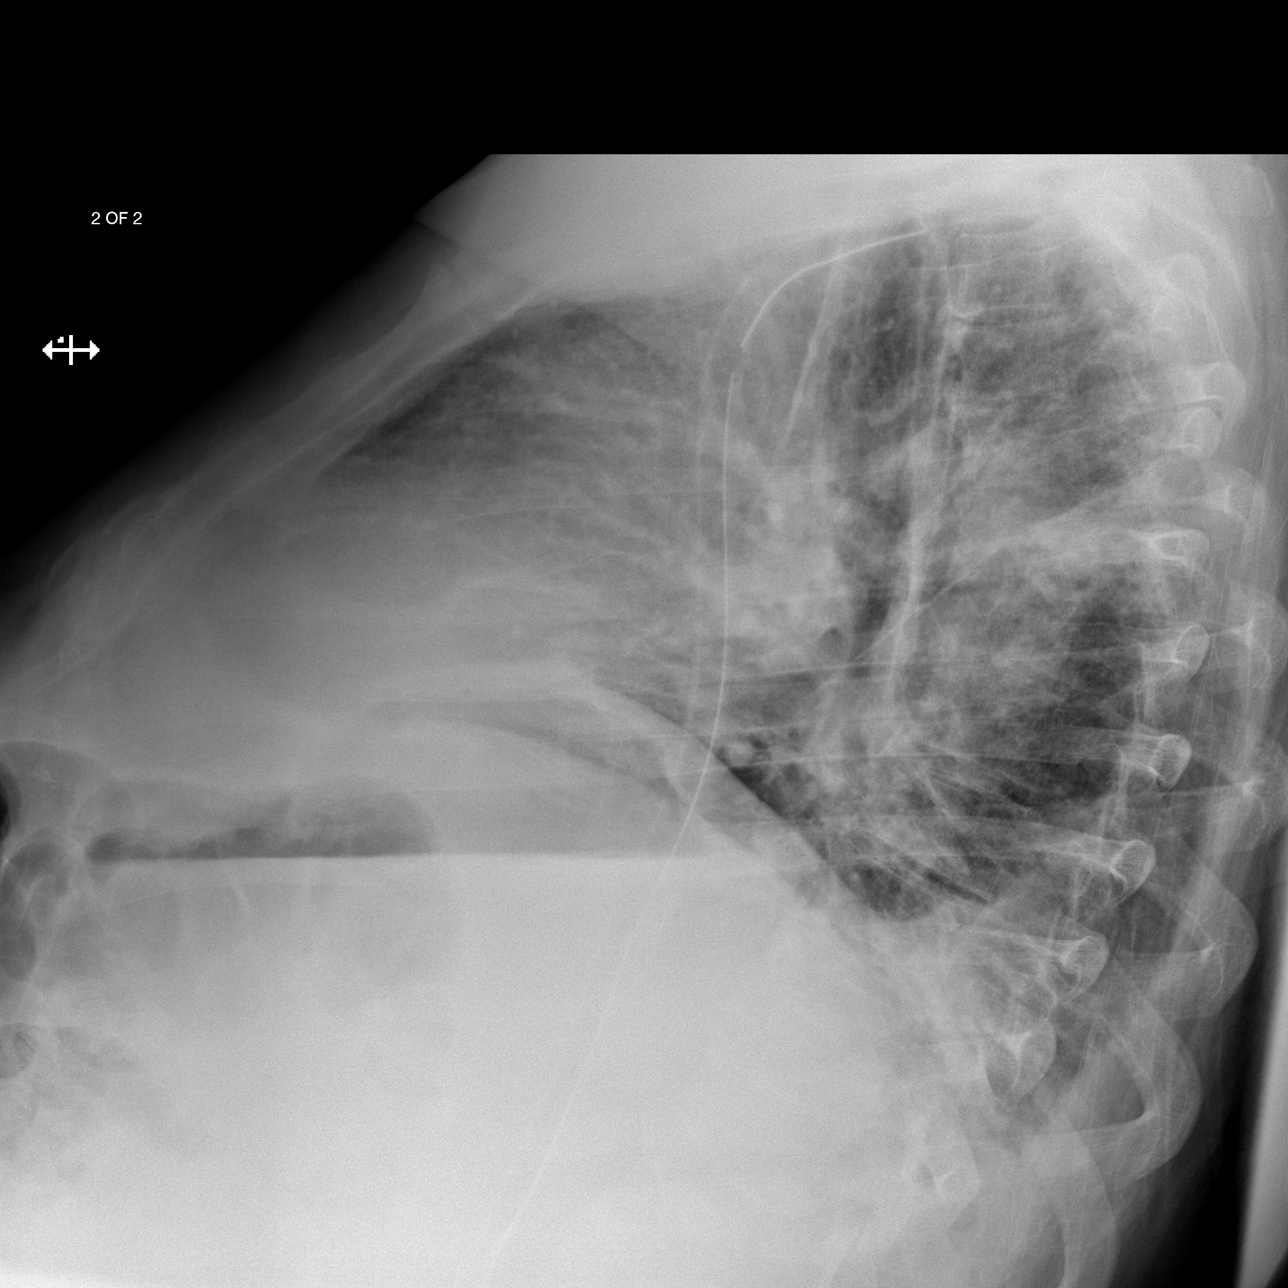

[3 of 3 positions shown; findings below may reference images not displayed]

FINDINGS: Left chest tube is stable, tip near the left apex.  No pneumothorax.

Mild opacity along the lateral left mid to lower lung and at the
left lung base consistent with atelectasis. Remainder of the lungs
is clear. Lung volumes are low.

Minimal left pleural effusion.

Cardiac silhouette is mildly enlarged. No mediastinal or hilar
masses.
IMPRESSION: 1. No significant change from the prior study. Stable chest tube. No
pneumothorax.
2. Persistent mild left mid to lower lung atelectasis with minimal
left pleural fluid.

## 2019-04-29 IMAGING — CR DG CHEST 2V
2 series · 3 of 3 positions shown · non-contrast
Comparison: Radiographs 07/02/2018 and 07/01/2018.

CLINICAL DATA: Left lower lobe lung biopsy/VATS 06/30/2018.

EXAM:
CHEST - 2 VIEW

[Series 2: chest lat · 0.14mm/px · 2 of 2 slices shown]
[im 1/2]
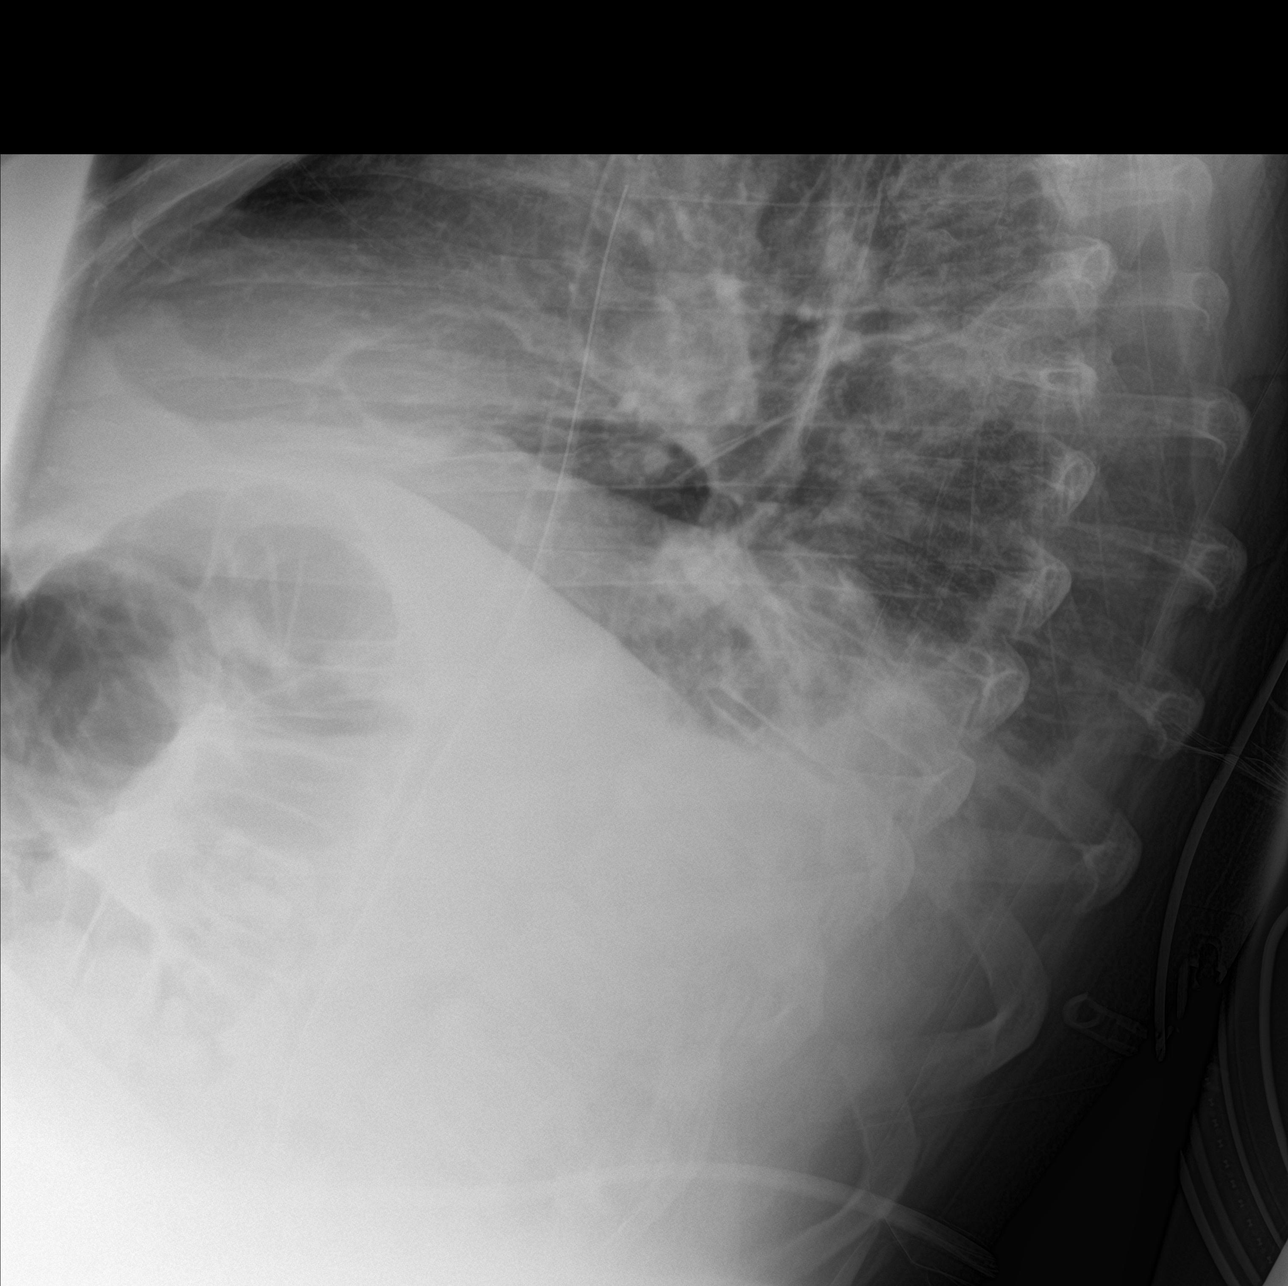
[im 2/2]
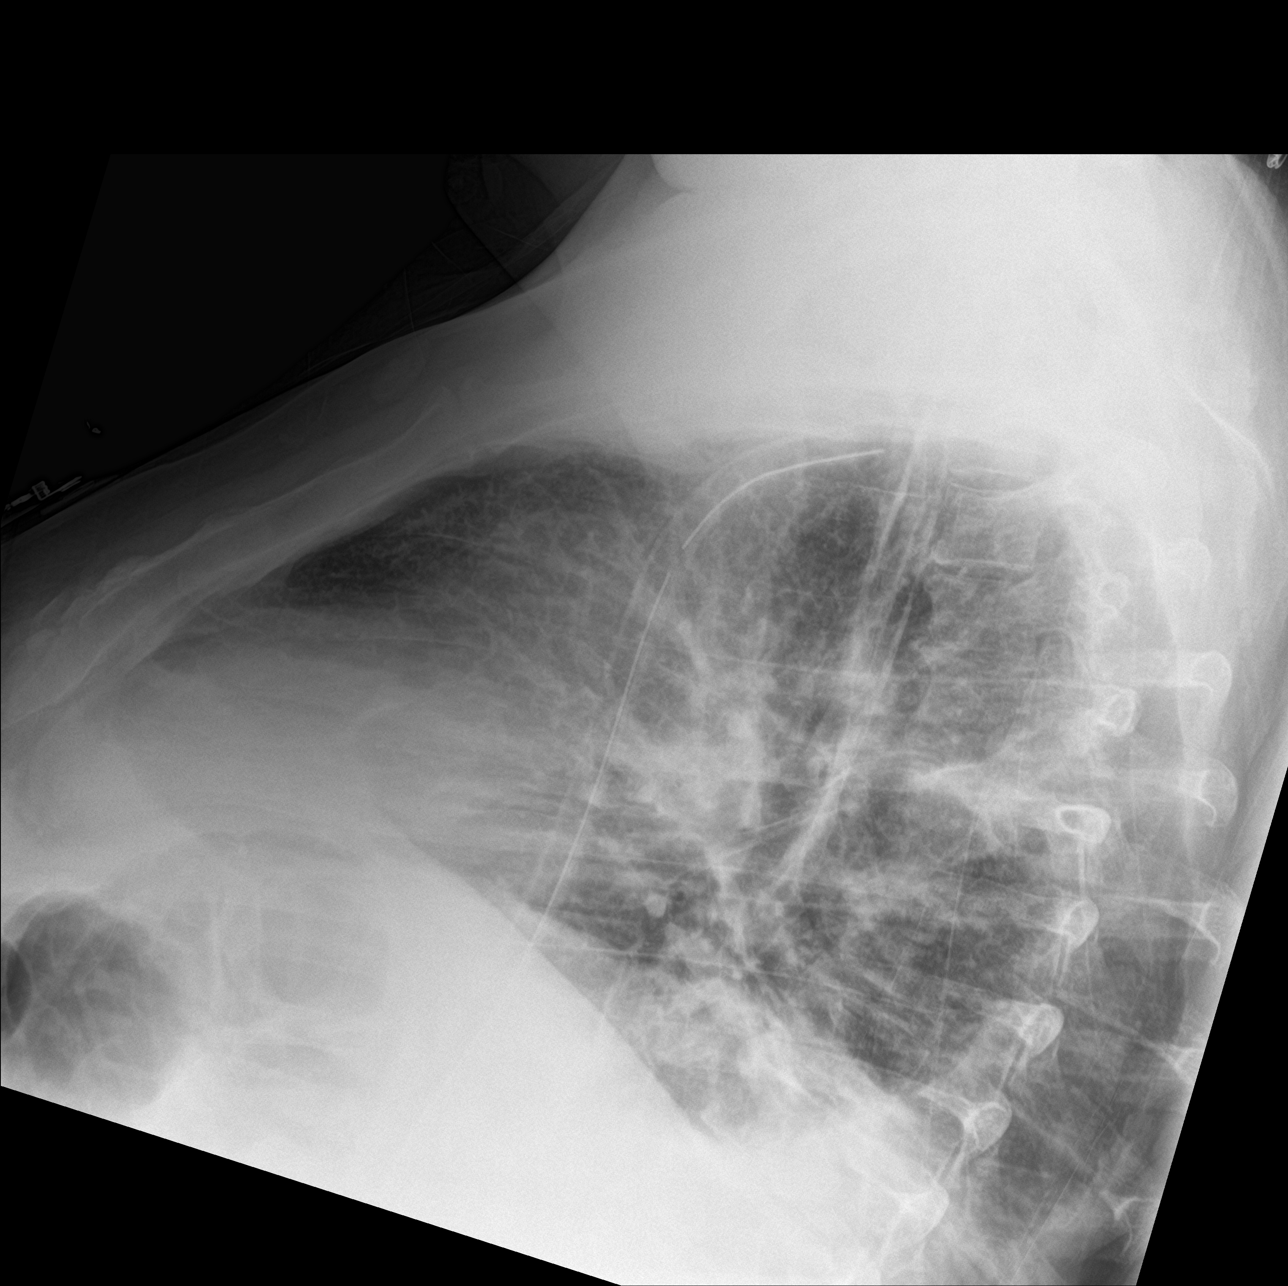

[chest ap]
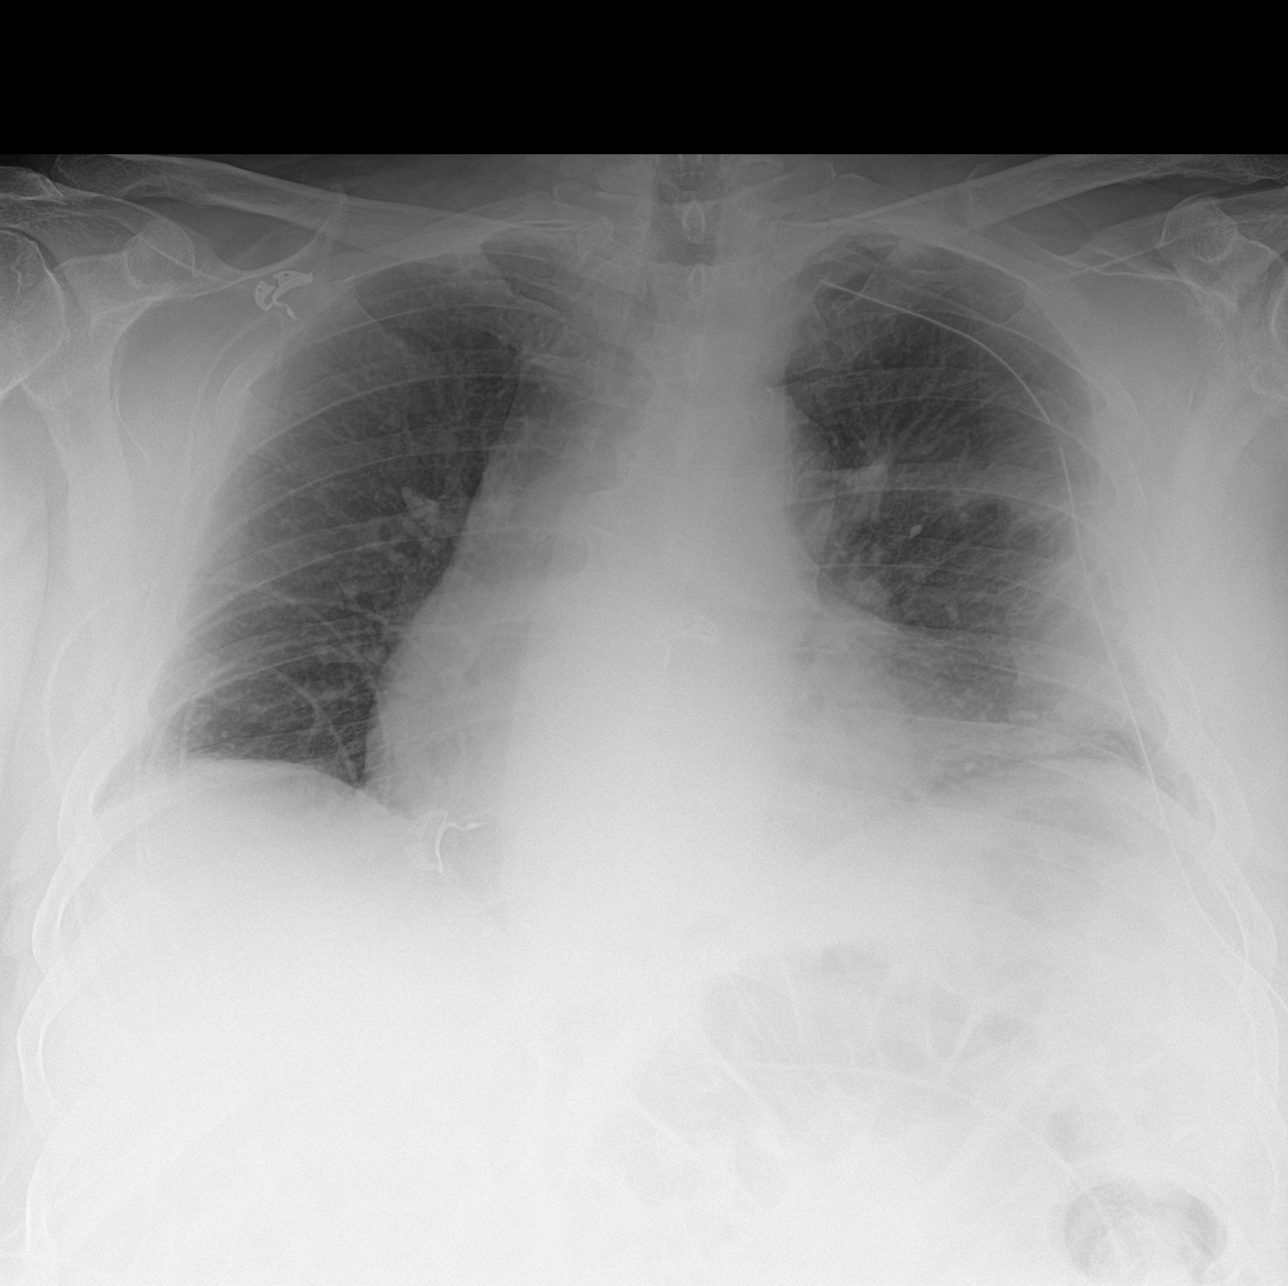

[3 of 3 positions shown; findings below may reference images not displayed]

FINDINGS: Left chest tube is unchanged in position. There is a stable tiny
left apical pneumothorax. A small amount of loculated left pleural
fluid versus thickening and adjacent subpleural pulmonary opacities
are stable. The right lung is clear. The heart size and mediastinal
contours are stable.
IMPRESSION: Stable postoperative chest with minimal left apical pneumothorax.

## 2019-05-05 DIAGNOSIS — E669 Obesity, unspecified: Secondary | ICD-10-CM | POA: Diagnosis not present

## 2019-05-05 DIAGNOSIS — I8290 Acute embolism and thrombosis of unspecified vein: Secondary | ICD-10-CM | POA: Diagnosis not present

## 2019-05-11 DIAGNOSIS — E785 Hyperlipidemia, unspecified: Secondary | ICD-10-CM | POA: Diagnosis not present

## 2019-05-11 DIAGNOSIS — E7800 Pure hypercholesterolemia, unspecified: Secondary | ICD-10-CM | POA: Diagnosis not present

## 2019-05-11 DIAGNOSIS — Z6841 Body Mass Index (BMI) 40.0 and over, adult: Secondary | ICD-10-CM | POA: Diagnosis not present

## 2019-05-11 DIAGNOSIS — Z86718 Personal history of other venous thrombosis and embolism: Secondary | ICD-10-CM | POA: Diagnosis not present

## 2019-05-11 DIAGNOSIS — I10 Essential (primary) hypertension: Secondary | ICD-10-CM | POA: Diagnosis not present

## 2019-06-01 DIAGNOSIS — J302 Other seasonal allergic rhinitis: Secondary | ICD-10-CM | POA: Diagnosis not present

## 2019-06-01 DIAGNOSIS — I8290 Acute embolism and thrombosis of unspecified vein: Secondary | ICD-10-CM | POA: Diagnosis not present

## 2019-06-21 DIAGNOSIS — J302 Other seasonal allergic rhinitis: Secondary | ICD-10-CM | POA: Diagnosis not present

## 2019-06-21 DIAGNOSIS — B450 Pulmonary cryptococcosis: Secondary | ICD-10-CM | POA: Diagnosis not present

## 2019-06-21 DIAGNOSIS — I8290 Acute embolism and thrombosis of unspecified vein: Secondary | ICD-10-CM | POA: Diagnosis not present

## 2019-07-05 DIAGNOSIS — Z23 Encounter for immunization: Secondary | ICD-10-CM | POA: Diagnosis not present

## 2019-07-05 DIAGNOSIS — Z0000 Encounter for general adult medical examination without abnormal findings: Secondary | ICD-10-CM | POA: Diagnosis not present

## 2019-07-05 DIAGNOSIS — Z01811 Encounter for preprocedural respiratory examination: Secondary | ICD-10-CM | POA: Diagnosis not present

## 2019-07-05 DIAGNOSIS — I825Y2 Chronic embolism and thrombosis of unspecified deep veins of left proximal lower extremity: Secondary | ICD-10-CM | POA: Diagnosis not present

## 2019-07-05 DIAGNOSIS — E785 Hyperlipidemia, unspecified: Secondary | ICD-10-CM | POA: Diagnosis not present

## 2019-07-05 DIAGNOSIS — Z6841 Body Mass Index (BMI) 40.0 and over, adult: Secondary | ICD-10-CM | POA: Diagnosis not present

## 2019-07-05 DIAGNOSIS — I10 Essential (primary) hypertension: Secondary | ICD-10-CM | POA: Diagnosis not present

## 2019-07-14 ENCOUNTER — Other Ambulatory Visit: Payer: Medicare HMO | Attending: Internal Medicine

## 2019-07-15 ENCOUNTER — Other Ambulatory Visit
Admission: RE | Admit: 2019-07-15 | Discharge: 2019-07-15 | Disposition: A | Discharge status: Home / Self Care | Payer: Medicare HMO | Source: Ambulatory Visit | Attending: Internal Medicine | Admitting: Internal Medicine

## 2019-07-15 ENCOUNTER — Encounter: Payer: Self-pay | Admitting: Internal Medicine

## 2019-07-15 DIAGNOSIS — Z20822 Contact with and (suspected) exposure to covid-19: Secondary | ICD-10-CM | POA: Insufficient documentation

## 2019-07-15 DIAGNOSIS — Z01812 Encounter for preprocedural laboratory examination: Secondary | ICD-10-CM | POA: Diagnosis not present

## 2019-07-15 LAB — SARS CORONAVIRUS 2 (TAT 6-24 HRS): SARS Coronavirus 2: NEGATIVE

## 2019-07-18 ENCOUNTER — Encounter: Payer: Self-pay | Admitting: Internal Medicine

## 2019-07-18 ENCOUNTER — Ambulatory Visit: Payer: Medicare HMO | Admitting: Anesthesiology

## 2019-07-18 ENCOUNTER — Encounter
Admission: RE | Disposition: A | Discharge status: Home / Self Care | Payer: Self-pay | Source: Home / Self Care | Attending: Internal Medicine

## 2019-07-18 ENCOUNTER — Ambulatory Visit
Admission: RE | Admit: 2019-07-18 | Discharge: 2019-07-18 | Disposition: A | Discharge status: Home / Self Care | Payer: Medicare HMO | Attending: Internal Medicine | Admitting: Internal Medicine

## 2019-07-18 DIAGNOSIS — J302 Other seasonal allergic rhinitis: Secondary | ICD-10-CM | POA: Insufficient documentation

## 2019-07-18 DIAGNOSIS — Z86718 Personal history of other venous thrombosis and embolism: Secondary | ICD-10-CM | POA: Insufficient documentation

## 2019-07-18 DIAGNOSIS — K635 Polyp of colon: Secondary | ICD-10-CM | POA: Diagnosis not present

## 2019-07-18 DIAGNOSIS — K5790 Diverticulosis of intestine, part unspecified, without perforation or abscess without bleeding: Secondary | ICD-10-CM | POA: Diagnosis not present

## 2019-07-18 DIAGNOSIS — Z1211 Encounter for screening for malignant neoplasm of colon: Secondary | ICD-10-CM | POA: Insufficient documentation

## 2019-07-18 DIAGNOSIS — Z79899 Other long term (current) drug therapy: Secondary | ICD-10-CM | POA: Insufficient documentation

## 2019-07-18 DIAGNOSIS — E7800 Pure hypercholesterolemia, unspecified: Secondary | ICD-10-CM | POA: Diagnosis not present

## 2019-07-18 DIAGNOSIS — K5730 Diverticulosis of large intestine without perforation or abscess without bleeding: Secondary | ICD-10-CM | POA: Diagnosis not present

## 2019-07-18 DIAGNOSIS — Z6841 Body Mass Index (BMI) 40.0 and over, adult: Secondary | ICD-10-CM | POA: Diagnosis not present

## 2019-07-18 DIAGNOSIS — Q438 Other specified congenital malformations of intestine: Secondary | ICD-10-CM | POA: Diagnosis not present

## 2019-07-18 DIAGNOSIS — Z86010 Personal history of colonic polyps: Secondary | ICD-10-CM | POA: Diagnosis not present

## 2019-07-18 DIAGNOSIS — K648 Other hemorrhoids: Secondary | ICD-10-CM | POA: Diagnosis not present

## 2019-07-18 DIAGNOSIS — D120 Benign neoplasm of cecum: Secondary | ICD-10-CM | POA: Diagnosis not present

## 2019-07-18 DIAGNOSIS — Z7901 Long term (current) use of anticoagulants: Secondary | ICD-10-CM | POA: Insufficient documentation

## 2019-07-18 DIAGNOSIS — K640 First degree hemorrhoids: Secondary | ICD-10-CM | POA: Diagnosis not present

## 2019-07-18 DIAGNOSIS — E669 Obesity, unspecified: Secondary | ICD-10-CM | POA: Insufficient documentation

## 2019-07-18 DIAGNOSIS — R609 Edema, unspecified: Secondary | ICD-10-CM | POA: Diagnosis not present

## 2019-07-18 HISTORY — PX: COLONOSCOPY WITH PROPOFOL: SHX5780

## 2019-07-18 SURGERY — COLONOSCOPY WITH PROPOFOL
Anesthesia: General

## 2019-07-18 MED ORDER — MIDAZOLAM HCL 2 MG/2ML IJ SOLN
INTRAMUSCULAR | Status: AC
  Filled 2019-07-18: qty 2

## 2019-07-18 MED ORDER — FENTANYL CITRATE (PF) 100 MCG/2ML IJ SOLN
INTRAMUSCULAR | Status: AC
  Filled 2019-07-18: qty 2

## 2019-07-18 MED ORDER — SODIUM CHLORIDE 0.9 % IV SOLN: INTRAVENOUS | Status: DC

## 2019-07-18 MED ORDER — PROPOFOL 10 MG/ML IV BOLUS
INTRAVENOUS | Status: DC | PRN
  Administered 2019-07-18: 20 mg via INTRAVENOUS
  Administered 2019-07-18: 30 mg via INTRAVENOUS

## 2019-07-18 MED ORDER — LIDOCAINE HCL (PF) 2 % IJ SOLN
INTRAMUSCULAR | Status: DC | PRN
  Administered 2019-07-18: 100 mg

## 2019-07-18 MED ORDER — MIDAZOLAM HCL 5 MG/5ML IJ SOLN
INTRAMUSCULAR | Status: DC | PRN
  Administered 2019-07-18: 2 mg via INTRAVENOUS

## 2019-07-18 MED ORDER — PROPOFOL 10 MG/ML IV BOLUS
INTRAVENOUS | Status: AC
  Filled 2019-07-18: qty 20

## 2019-07-18 MED ORDER — FENTANYL CITRATE (PF) 100 MCG/2ML IJ SOLN
INTRAMUSCULAR | Status: DC | PRN
  Administered 2019-07-18 (×2): 50 ug via INTRAVENOUS

## 2019-07-18 MED ORDER — PROPOFOL 500 MG/50ML IV EMUL
INTRAVENOUS | Status: DC | PRN
  Administered 2019-07-18: 50 ug/kg/min via INTRAVENOUS

## 2019-07-18 NOTE — Interval H&P Note (Signed)
History and Physical Interval Note:  07/18/2019 1:22 PM  John Jenkins  has presented today for surgery, with the diagnosis of HX.OF COLON POLYPS.  The various methods of treatment have been discussed with the patient and family. After consideration of risks, benefits and other options for treatment, the patient has consented to  Procedure(s): COLONOSCOPY WITH PROPOFOL (N/A) as a surgical intervention.  The patient's history has been reviewed, patient examined, no change in status, stable for surgery.  I have reviewed the patient's chart and labs.  Questions were answered to the patient's satisfaction.     [place], [place]

## 2019-07-18 NOTE — Transfer of Care (Signed)
Immediate Anesthesia Transfer of Care Note  Patient: John Jenkins  Procedure(s) Performed: COLONOSCOPY WITH PROPOFOL (N/A )  Patient Location: PACU  Anesthesia Type:General  Level of Consciousness: sedated  Airway & Oxygen Therapy: Patient Spontanous Breathing  Post-op Assessment: Report given to RN and Post -op Vital signs reviewed and stable  Post vital signs: Reviewed and stable  Last Vitals:  Vitals Value Taken Time  BP    Temp    Pulse    Resp    SpO2      Last Pain:  Vitals:   07/18/19 1206  TempSrc: Temporal  PainSc: 0-No pain         Complications: No apparent anesthesia complications

## 2019-07-18 NOTE — Anesthesia Preprocedure Evaluation (Addendum)
Anesthesia Evaluation  Patient identified by MRN, date of birth, ID band Patient awake    Reviewed: Allergy & Precautions, H&P , NPO status , Patient's Chart, lab work & pertinent test results  History of Anesthesia Complications Negative for: history of anesthetic complications  Airway Mallampati: III  TM Distance: >3 FB Neck ROM: full    Dental  (+) Chipped   Pulmonary neg COPD,  Lung mass ILD          Cardiovascular (-) angina(-) Past MI and (-) Cardiac Stents negative cardio ROS  (-) dysrhythmias      Neuro/Psych negative neurological ROS  negative psych ROS   GI/Hepatic negative GI ROS, Neg liver ROS,   Endo/Other  Morbid obesity  Renal/GU negative Renal ROS  negative genitourinary   Musculoskeletal   Abdominal   Peds  Hematology negative hematology ROS (+)   Anesthesia Other Findings Past Medical History: 2019: DVT (deep venous thrombosis) (HCC) No date: Obese No date: Pneumonia No date: Seasonal allergies  Past Surgical History: 1974: ANKLE SURGERY 1970: broken arm 07/20/2018: IVC FILTER REMOVAL; N/A     Comment:  Procedure: IVC FILTER REMOVAL;  Surgeon: Katha Cabal, MD;  Location: [facility];  Service:              Cardiovascular;  Laterality: N/A; 05/04/2018: PERIPHERAL VASCULAR THROMBECTOMY; Left     Comment:  Procedure: PERIPHERAL VASCULAR THROMBECTOMY;  Surgeon:               Katha Cabal, MD;  Location: [facility];               Service: Cardiovascular;  Laterality: Left; 06/30/2018: VIDEO ASSISTED THORACOSCOPY (VATS)/THOROCOTOMY; Left     Comment:  Procedure: VIDEO ASSISTED THORACOSCOPY               (VATS)/THOROCOTOMY WITH LUNG BIOPSY-LEFT  PRE-OP BRONCH;               Surgeon: Nestor Lewandowsky, MD;  Location: [facility];                Service: General;  Laterality: Left;  BMI    Body Mass Index: 41.63 kg/m       Reproductive/Obstetrics negative OB ROS                           Anesthesia Physical Anesthesia Plan  ASA: III  Anesthesia Plan: General   Post-op Pain Management:    Induction:   PONV Risk Score and Plan: Propofol infusion and TIVA  Airway Management Planned: Natural Airway and Nasal Cannula  Additional Equipment:   Intra-op Plan:   Post-operative Plan:   Informed Consent: I have reviewed the patients History and Physical, chart, labs and discussed the procedure including the risks, benefits and alternatives for the proposed anesthesia with the patient or authorized representative who has indicated his/her understanding and acceptance.     Dental Advisory Given  Plan Discussed with: Anesthesiologist  Anesthesia Plan Comments:         Anesthesia Quick Evaluation

## 2019-07-18 NOTE — Op Note (Signed)
[facility] Gastroenterology Patient Name: John Jenkins Procedure Date: 07/18/2019 1:54 PM MRN: [id number] Account #: [id number] Date of Birth: 1951-02-26 Admit Type: Outpatient Age: 69 Room: [facility] ENDO ROOM 3 Gender: Male Note Status: Finalized Procedure:             Colonoscopy Indications:           Surveillance: Personal history of adenomatous polyps                         on last colonoscopy > 5 years ago Providers:             Lorie Apley K. Alice Reichert MD, MD Referring MD:          Adrian Prows (Referring MD) Medicines:             Propofol per Anesthesia Complications:         No immediate complications. Procedure:             Pre-Anesthesia Assessment:                        - The risks and benefits of the procedure and the                         sedation options and risks were discussed with the                         patient. All questions were answered and informed                         consent was obtained.                        - Patient identification and proposed procedure were                         verified prior to the procedure by the nurse. The                         procedure was verified in the procedure room.                        - ASA Grade Assessment: III - A patient with severe                         systemic disease.                        - After reviewing the risks and benefits, the patient                         was deemed in satisfactory condition to undergo the                         procedure.                        After obtaining informed consent, the colonoscope was                         passed under direct vision. Throughout  the procedure,                         the patient's blood pressure, pulse, and oxygen                         saturations were monitored continuously. The                         Colonoscope was introduced through the anus and                         advanced to the the cecum, identified by  appendiceal                         orifice and ileocecal valve. The patient tolerated the                         procedure well. The colonoscopy was somewhat difficult                         due to a redundant colon and significant looping.                         Successful completion of the procedure was aided by                         applying abdominal pressure. The patient tolerated the                         procedure well. The quality of the bowel preparation                         was adequate. The ileocecal valve, appendiceal                         orifice, and rectum were photographed. Findings:      The perianal and digital rectal examinations were normal. Pertinent       negatives include normal sphincter tone and no palpable rectal lesions.      Multiple small and large-mouthed diverticula were found in the sigmoid       colon.      A 3 mm polyp was found in the ileocecal valve. The polyp was sessile.       The polyp was removed with a jumbo cold forceps. Resection and retrieval       were complete.      Non-bleeding internal hemorrhoids were found during retroflexion. The       hemorrhoids were Grade I (internal hemorrhoids that do not prolapse).      The exam was otherwise without abnormality. Impression:            - Diverticulosis in the sigmoid colon.                        - One 3 mm polyp at the ileocecal valve, removed with                         a jumbo cold forceps. Resected and retrieved.                        -  Non-bleeding internal hemorrhoids.                        - The examination was otherwise normal. Recommendation:        - Patient has a contact number available for                         emergencies. The signs and symptoms of potential                         delayed complications were discussed with the patient.                         Return to normal activities tomorrow. Written                         discharge instructions were provided to  the patient.                        - Resume previous diet.                        - Continue present medications.                        - Repeat colonoscopy in 5 years for surveillance.                        - Return to GI office PRN.                        - The findings and recommendations were discussed with                         the patient. Procedure Code(s):     --- Professional ---                        [phone], Colonoscopy, flexible; with biopsy, single or                         multiple Diagnosis Code(s):     --- Professional ---                        K57.30, Diverticulosis of large intestine without                         perforation or abscess without bleeding                        K63.5, Polyp of colon                        K64.0, First degree hemorrhoids                        Z86.010, Personal history of colonic polyps CPT copyright 2019 [employer]. All rights reserved. The codes documented in this report are preliminary and upon coder review may  be revised to meet current compliance requirements. Efrain Sella MD, MD 07/18/2019 2:21:15 PM This report has been signed  electronically. Number of Addenda: 0 Note Initiated On: 07/18/2019 1:54 PM Scope Withdrawal Time: 0 hours 8 minutes 9 seconds  Total Procedure Duration: 0 hours 15 minutes 23 seconds  Estimated Blood Loss:  Estimated blood loss: none.      [facility]

## 2019-07-18 NOTE — H&P (Signed)
Outpatient short stay form Pre-procedure 07/18/2019 1:21 PM Markise Haymer K. Alice Reichert, M.D.  Primary Physician: Adrian Prows, M.D.  Reason for visit:  Personal hx of adenomatous colon polyps  History of present illness:                           Patient presents for colonoscopy for a personal hx of colon polyps. The patient denies abdominal pain, abnormal weight loss or rectal bleeding.      Current Facility-Administered Medications:  .  0.9 %  sodium chloride infusion, , Intravenous, Continuous, [place], Benay Pike, MD, Last Rate: 20 mL/hr at 07/18/19 1227, New Bag at 07/18/19 1227  Medications Prior to Admission  Medication Sig Dispense Refill Last Dose  . Calcium-Magnesium-Zinc (CAL-MAG-ZINC PO) Take 1 tablet by mouth daily.   Past Week at Unknown time  . Cinnamon 500 MG capsule Take 500 mg by mouth daily.   Past Week at Unknown time  . COLLAGEN-VITAMIN C PO Take 1 tablet by mouth daily.   Past Week at Unknown time  . fluticasone (FLONASE) 50 MCG/ACT nasal spray Place 2 sprays into the nose daily.   Past Week at Unknown time  . levocetirizine (XYZAL) 5 MG tablet Take 5 mg by mouth every evening.   Past Week at Unknown time  . loratadine (CLARITIN) 10 MG tablet Take 10 mg by mouth at bedtime.   Past Week at Unknown time  . Multiple Vitamin (MULTIVITAMIN WITH MINERALS) TABS tablet Take 1 tablet by mouth daily. Centrum Silver   Past Week at Unknown time  . Omega-3 Fatty Acids (FISH OIL PO) Take 1 capsule by mouth daily.    Past Week at Unknown time  . senna-docusate (SENOKOT-S) 8.6-50 MG tablet Take 1 tablet by mouth daily as needed for mild constipation.   Past Week at Unknown time  . vitamin B-12 (CYANOCOBALAMIN) 1000 MCG tablet Take 1,000 mcg by mouth daily.   Past Week at Unknown time  . vitamin C (ASCORBIC ACID) 500 MG tablet Take 500 mg by mouth daily.   Past Week at Unknown time  . zinc gluconate 50 MG tablet Take 50 mg by mouth daily.   Past Week at Unknown time  . apixaban (ELIQUIS) 5  MG TABS tablet Take 5mg  daily (Patient not taking: Reported on 07/18/2019) 90 tablet 5 Not Taking at Unknown time  . apixaban (ELIQUIS) 5 MG TABS tablet Take 1 tablet (5 mg total) by mouth 2 (two) times daily. (Patient taking differently: Take 2.5 mg by mouth 2 (two) times daily. ) 60 tablet 3 07/12/2019  . fluconazole (DIFLUCAN) 200 MG tablet Take 4 tablets (800 mg total) by mouth daily. (Patient not taking: Reported on 07/18/2019) 60 tablet 0 Completed Course at Unknown time  . metoprolol tartrate (LOPRESSOR) 50 MG tablet Take 1 tablet (50 mg total) by mouth 2 (two) times daily. (Patient not taking: Reported on 10/12/2018) 30 tablet 0   . potassium gluconate 595 (99 K) MG TABS tablet Take 595 mg by mouth daily.   Not Taking at Unknown time  . traMADol (ULTRAM) 50 MG tablet Take 1-2 tablets (50-100 mg total) by mouth every 6 (six) hours. (Patient not taking: Reported on 10/12/2018) 30 tablet 0      No Known Allergies   Past Medical History:  Diagnosis Date  . DVT (deep venous thrombosis) ([place]) 2019  . Obese   . Pneumonia   . Seasonal allergies     Review of systems:  Otherwise negative.    Physical Exam  Gen: Alert, oriented. Appears stated age.  HEENT: [place]/AT. PERRLA. Lungs: CTA, no wheezes. CV: RR nl S1, S2. Abd: soft, benign, no masses. BS+ Ext: No edema. Pulses 2+    Planned procedures: Proceed with colonoscopy. The patient understands the nature of the planned procedure, indications, risks, alternatives and potential complications including but not limited to bleeding, infection, perforation, damage to internal organs and possible oversedation/side effects from anesthesia. The patient agrees and gives consent to proceed.  Please refer to procedure notes for findings, recommendations and patient disposition/instructions.     Jakelyn Squyres K. Alice Reichert, M.D. Gastroenterology 07/18/2019  1:21 PM

## 2019-07-19 ENCOUNTER — Encounter: Payer: Self-pay | Admitting: *Deleted

## 2019-07-19 NOTE — Anesthesia Postprocedure Evaluation (Signed)
Anesthesia Post Note  Patient: John Jenkins  Procedure(s) Performed: COLONOSCOPY WITH PROPOFOL (N/A )  Patient location during evaluation: Endoscopy Anesthesia Type: General Level of consciousness: awake and alert Pain management: pain level controlled Vital Signs Assessment: post-procedure vital signs reviewed and stable Respiratory status: spontaneous breathing, nonlabored ventilation, respiratory function stable and patient connected to nasal cannula oxygen Cardiovascular status: blood pressure returned to baseline and stable Postop Assessment: no apparent nausea or vomiting Anesthetic complications: no     Last Vitals:  Vitals:   07/18/19 1435 07/18/19 1445  BP: 111/76 131/73  Pulse: 69 69  Resp: 17 18  Temp:    SpO2: 97% 99%    Last Pain:  Vitals:   07/18/19 1445  TempSrc:   PainSc: 0-No pain                 Arita Miss

## 2019-07-20 LAB — SURGICAL PATHOLOGY

## 2019-07-26 DIAGNOSIS — I8290 Acute embolism and thrombosis of unspecified vein: Secondary | ICD-10-CM | POA: Diagnosis not present

## 2019-07-26 DIAGNOSIS — B450 Pulmonary cryptococcosis: Secondary | ICD-10-CM | POA: Diagnosis not present

## 2019-07-29 ENCOUNTER — Other Ambulatory Visit (INDEPENDENT_AMBULATORY_CARE_PROVIDER_SITE_OTHER): Payer: Self-pay | Admitting: Nurse Practitioner

## 2019-09-06 DIAGNOSIS — I8290 Acute embolism and thrombosis of unspecified vein: Secondary | ICD-10-CM | POA: Diagnosis not present

## 2019-09-06 DIAGNOSIS — E669 Obesity, unspecified: Secondary | ICD-10-CM | POA: Diagnosis not present

## 2019-09-29 DIAGNOSIS — K5790 Diverticulosis of intestine, part unspecified, without perforation or abscess without bleeding: Secondary | ICD-10-CM | POA: Diagnosis not present

## 2019-09-29 DIAGNOSIS — B450 Pulmonary cryptococcosis: Secondary | ICD-10-CM | POA: Diagnosis not present

## 2019-10-06 DIAGNOSIS — E669 Obesity, unspecified: Secondary | ICD-10-CM | POA: Diagnosis not present

## 2019-10-06 DIAGNOSIS — I10 Essential (primary) hypertension: Secondary | ICD-10-CM | POA: Diagnosis not present

## 2019-10-11 DIAGNOSIS — E669 Obesity, unspecified: Secondary | ICD-10-CM | POA: Diagnosis not present

## 2019-10-11 DIAGNOSIS — I10 Essential (primary) hypertension: Secondary | ICD-10-CM | POA: Diagnosis not present

## 2019-10-13 ENCOUNTER — Ambulatory Visit (INDEPENDENT_AMBULATORY_CARE_PROVIDER_SITE_OTHER): Payer: Medicare HMO

## 2019-10-13 ENCOUNTER — Other Ambulatory Visit: Payer: Self-pay

## 2019-10-13 ENCOUNTER — Encounter (INDEPENDENT_AMBULATORY_CARE_PROVIDER_SITE_OTHER): Payer: Self-pay | Admitting: Nurse Practitioner

## 2019-10-13 ENCOUNTER — Ambulatory Visit (INDEPENDENT_AMBULATORY_CARE_PROVIDER_SITE_OTHER): Payer: Medicare HMO | Admitting: Nurse Practitioner

## 2019-10-13 VITALS — BP 130/80 | HR 72 | Ht 76.0 in | Wt 341.0 lb

## 2019-10-13 DIAGNOSIS — I10 Essential (primary) hypertension: Secondary | ICD-10-CM

## 2019-10-13 DIAGNOSIS — I82402 Acute embolism and thrombosis of unspecified deep veins of left lower extremity: Secondary | ICD-10-CM

## 2019-10-13 DIAGNOSIS — E7800 Pure hypercholesterolemia, unspecified: Secondary | ICD-10-CM

## 2019-10-14 ENCOUNTER — Encounter (INDEPENDENT_AMBULATORY_CARE_PROVIDER_SITE_OTHER): Payer: Self-pay | Admitting: Nurse Practitioner

## 2019-10-14 NOTE — Progress Notes (Signed)
Subjective:    Patient ID: John Jenkins, male    DOB: 1951-04-12, 69 y.o.   MRN: [id number] Chief Complaint  Patient presents with  . Follow-up    u/S Follow up    John Jenkins is a 69 y.o. male the presents today for noninvasive studies regarding his left lower extremity DVT.  The patient originally underwent thrombectomy on 05/04/2018 with IVC filter placement.  He then had his IVC filter removed on 07/20/2018.  Since that time the patient has had no issues currently.  He has no issues with his anticoagulation.  The swelling has been well controlled.  He utilizes medical grade 1 compression stockings on a daily basis.  He also is aggressive about vein his lower extremities as well as exercising on a daily basis.  Overall the patient seems to be doing well.  Today noninvasive studies show no evidence of DVT in either lower extremity.  There is no evidence of superficial venous thrombosis in the bilateral lower extremities.   Review of Systems  Cardiovascular: Positive for leg swelling.  All other systems reviewed and are negative.      Objective:   Physical Exam Vitals reviewed.  Cardiovascular:     Rate and Rhythm: Normal rate and regular rhythm.     Pulses: Normal pulses.     Heart sounds: Normal heart sounds.  Pulmonary:     Effort: Pulmonary effort is normal.     Breath sounds: Normal breath sounds.  Musculoskeletal:     Right lower leg: 1+ Edema present.     Left lower leg: 1+ Edema present.  Neurological:     Mental Status: He is alert and oriented to person, place, and time.  Psychiatric:        Mood and Affect: Mood normal.        Behavior: Behavior normal.        Thought Content: Thought content normal.        Judgment: Judgment normal.     BP 130/80   Pulse 72   Ht 6\' 4"  (1.93 m)   Wt (!) 341 lb (154.7 kg)   BMI 41.51 kg/m   Past Medical History:  Diagnosis Date  . DVT (deep venous thrombosis) ([place]) 2019  . Obese   . Pneumonia   . Seasonal allergies      Social History   Socioeconomic History  . Marital status: Single    Spouse name: Not on file  . Number of children: Not on file  . Years of education: Not on file  . Highest education level: Not on file  Occupational History  . Not on file  Tobacco Use  . Smoking status: Never Smoker  . Smokeless tobacco: Never Used  Substance and Sexual Activity  . Alcohol use: Never  . Drug use: Never  . Sexual activity: Not on file  Other Topics Concern  . Not on file  Social History Narrative  . Not on file   Social Determinants of Health   Financial Resource Strain:   . Difficulty of Paying Living Expenses:   Food Insecurity:   . Worried About Charity fundraiser in the Last Year:   . Arboriculturist in the Last Year:   Transportation Needs:   . Film/video editor (Medical):   Marland Kitchen Lack of Transportation (Non-Medical):   Physical Activity:   . Days of Exercise per Week:   . Minutes of Exercise per Session:   Stress:   .  Feeling of Stress :   Social Connections:   . Frequency of Communication with Friends and Family:   . Frequency of Social Gatherings with Friends and Family:   . Attends Religious Services:   . Active Member of Clubs or Organizations:   . Attends Archivist Meetings:   Marland Kitchen Marital Status:   Intimate Partner Violence:   . Fear of Current or Ex-Partner:   . Emotionally Abused:   Marland Kitchen Physically Abused:   . Sexually Abused:     Past Surgical History:  Procedure Laterality Date  . ANKLE SURGERY  1974  . broken arm  1970  . COLONOSCOPY WITH PROPOFOL N/A 07/18/2019   Procedure: COLONOSCOPY WITH PROPOFOL;  Surgeon: Toledo, Benay Pike, MD;  Location: [facility];  Service: Gastroenterology;  Laterality: N/A;  . IVC FILTER REMOVAL N/A 07/20/2018   Procedure: IVC FILTER REMOVAL;  Surgeon: Katha Cabal, MD;  Location: [facility];  Service: Cardiovascular;  Laterality: N/A;  . PERIPHERAL VASCULAR THROMBECTOMY Left 05/04/2018   Procedure:  PERIPHERAL VASCULAR THROMBECTOMY;  Surgeon: Katha Cabal, MD;  Location: [facility];  Service: Cardiovascular;  Laterality: Left;  Marland Kitchen VIDEO ASSISTED THORACOSCOPY (VATS)/THOROCOTOMY Left 06/30/2018   Procedure: VIDEO ASSISTED THORACOSCOPY (VATS)/THOROCOTOMY WITH LUNG BIOPSY-LEFT  PRE-OP BRONCH;  Surgeon: Nestor Lewandowsky, MD;  Location: [facility];  Service: General;  Laterality: Left;    Family History  Problem Relation Age of Onset  . Heart disease Mother   . Heart attack Father     No Known Allergies     Assessment & Plan:   1. Deep vein thrombosis (DVT) of left lower extremity, unspecified chronicity, unspecified vein (HCC) Patient doing well overall.  After discussion with patient he will remain on prophylactic dose of Eliquis at 2.5 mg.  Patient doing well with no episodes of bleeding.  Patient will continue with conservative therapy of wearing his compression socks, elevation and exercise.  Otherwise the patient will follow up in 1 year, he is instructed to call the office sooner if there are issues.  2. Essential hypertension Continue antihypertensive medications as already ordered, these medications have been reviewed and there are no changes at this time.   3. Pure hypercholesterolemia Continue statin as ordered and reviewed, no changes at this time    Current Outpatient Medications on File Prior to Visit  Medication Sig Dispense Refill  . apixaban (ELIQUIS) 5 MG TABS tablet Take 5mg  daily 90 tablet 5  . apixaban (ELIQUIS) 5 MG TABS tablet Take 1 tablet (5 mg total) by mouth 2 (two) times daily. (Patient taking differently: Take 2.5 mg by mouth 2 (two) times daily. ) 60 tablet 3  . Calcium-Magnesium-Zinc (CAL-MAG-ZINC PO) Take 1 tablet by mouth daily.    . Cinnamon 500 MG capsule Take 500 mg by mouth daily.    . COLLAGEN-VITAMIN C PO Take 1 tablet by mouth daily.    Marland Kitchen ELIQUIS 5 MG TABS tablet TAKE 1 TABLET BY MOUTH TWICE A DAY 60 tablet 3  . fluticasone (FLONASE)  50 MCG/ACT nasal spray Place 2 sprays into the nose daily.    Marland Kitchen levocetirizine (XYZAL) 5 MG tablet Take 5 mg by mouth every evening.    . loratadine (CLARITIN) 10 MG tablet Take 10 mg by mouth at bedtime.    . metoprolol tartrate (LOPRESSOR) 50 MG tablet Take 1 tablet (50 mg total) by mouth 2 (two) times daily. 30 tablet 0  . Multiple Vitamin (MULTIVITAMIN WITH MINERALS) TABS tablet Take  1 tablet by mouth daily. Centrum Silver    . Omega-3 Fatty Acids (FISH OIL PO) Take 1 capsule by mouth daily.     . potassium gluconate 595 (99 K) MG TABS tablet Take 595 mg by mouth daily.    . vitamin B-12 (CYANOCOBALAMIN) 1000 MCG tablet Take 1,000 mcg by mouth daily.    . vitamin C (ASCORBIC ACID) 500 MG tablet Take 500 mg by mouth daily.    Marland Kitchen zinc gluconate 50 MG tablet Take 50 mg by mouth daily.    . fluconazole (DIFLUCAN) 200 MG tablet Take 4 tablets (800 mg total) by mouth daily. (Patient not taking: Reported on 07/18/2019) 60 tablet 0  . senna-docusate (SENOKOT-S) 8.6-50 MG tablet Take 1 tablet by mouth daily as needed for mild constipation.    . traMADol (ULTRAM) 50 MG tablet Take 1-2 tablets (50-100 mg total) by mouth every 6 (six) hours. (Patient not taking: Reported on 10/12/2018) 30 tablet 0   No current facility-administered medications on file prior to visit.    There are no Patient Instructions on file for this visit. No follow-ups on file.   Kris Hartmann, NP

## 2019-10-25 IMAGING — CT CT CHEST WITH CONTRAST
2 of 4 series · 15 of 36 positions shown, 18 images · IV contrast (omnipaque)
Comparison: PET-CT dated 06/09/2018.  CT chest dated 05/26/2018.

CLINICAL DATA: Pulmonary cryptococcus, follow-up

EXAM:
CT CHEST WITH CONTRAST
TECHNIQUE: Multidetector CT imaging of the chest was performed during
intravenous contrast administration.
CONTRAST:  75mL OMNIPAQUE IOHEXOL 300 MG/ML  SOLN

[Series 2: axial chest · axial · 0.91mm/px · z∈[-1259,-947]mm · 12 of 186 slices shown, 15 images]
[im 15/186  mediastinal]
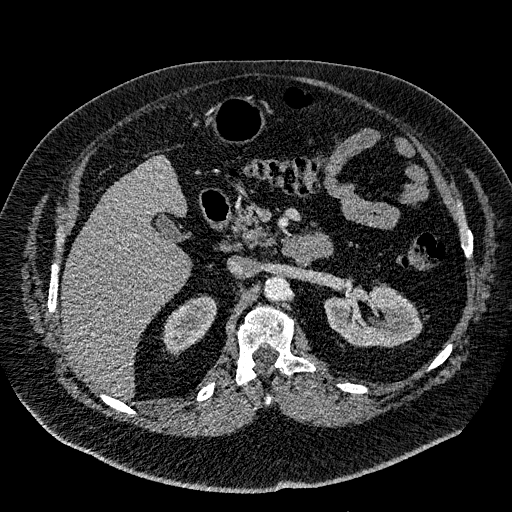
[im 15/186  lung]
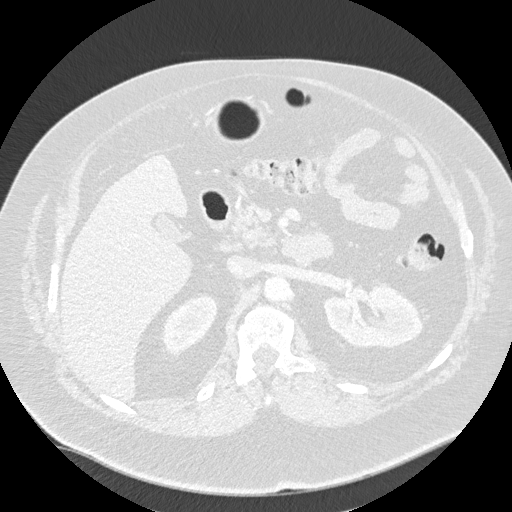
[im 29/186  lung]
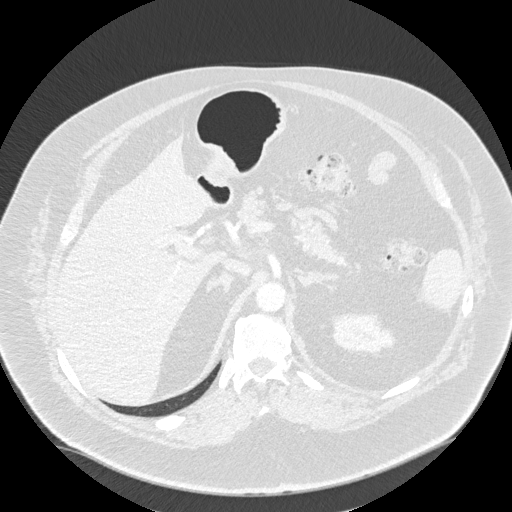
[im 43/186  lung]
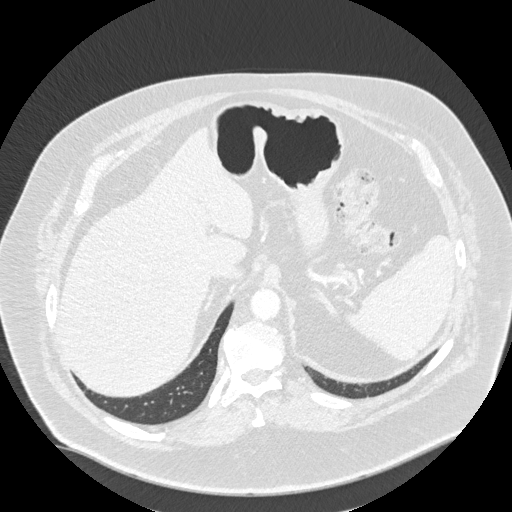
[im 57/186  lung]
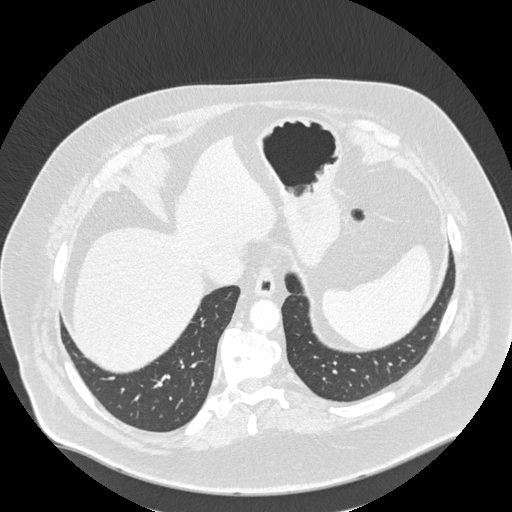
[im 72/186  mediastinal]
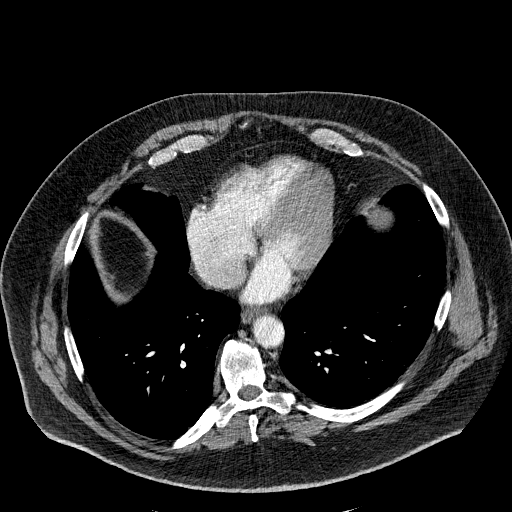
[im 72/186  lung]
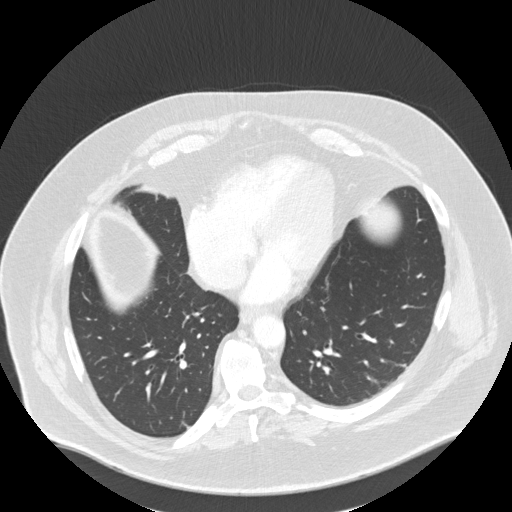
[im 86/186  lung]
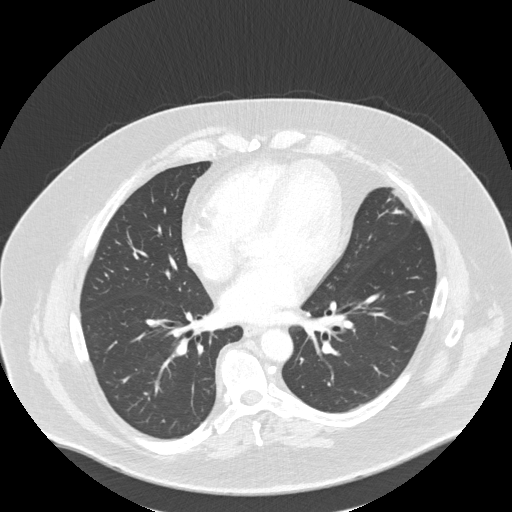
[im 100/186  lung]
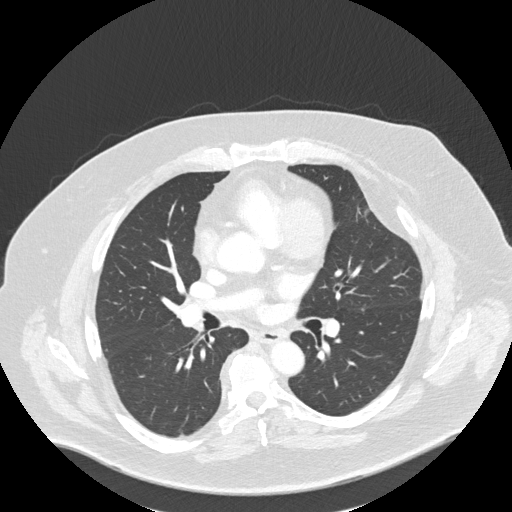
[im 114/186  lung]
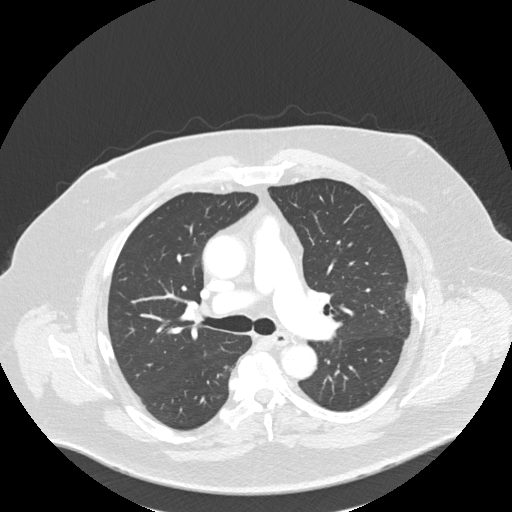
[im 129/186  mediastinal]
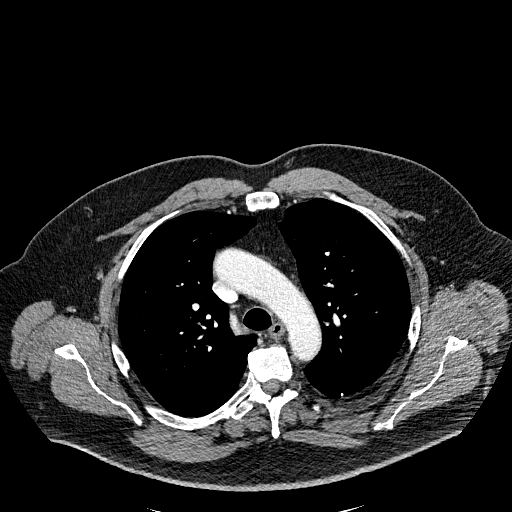
[im 129/186  lung]
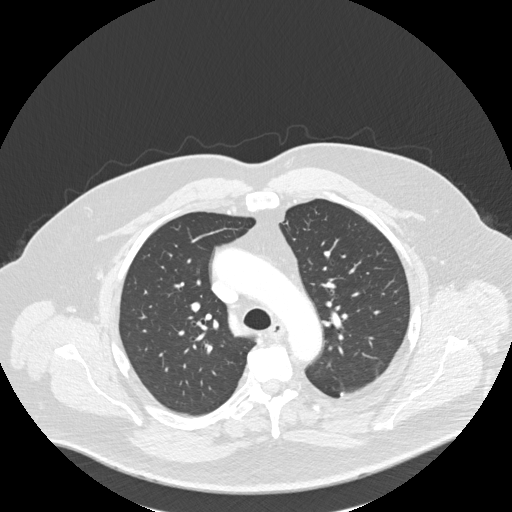
[im 143/186  lung]
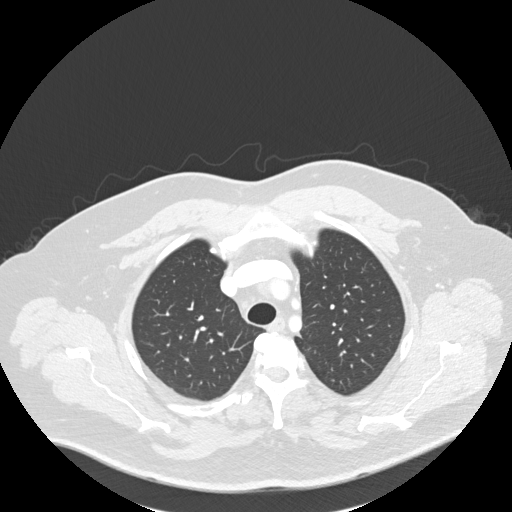
[im 157/186  lung]
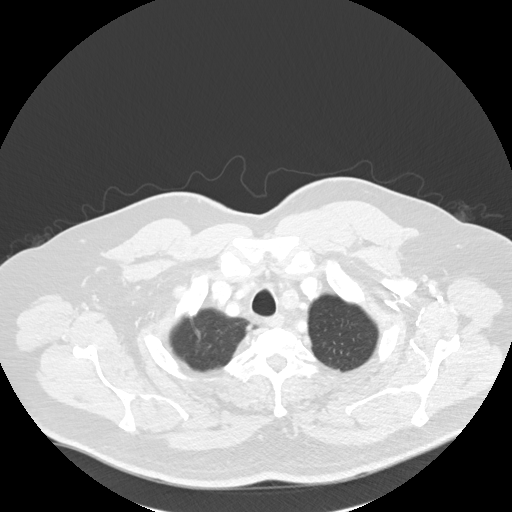
[im 171/186  lung]
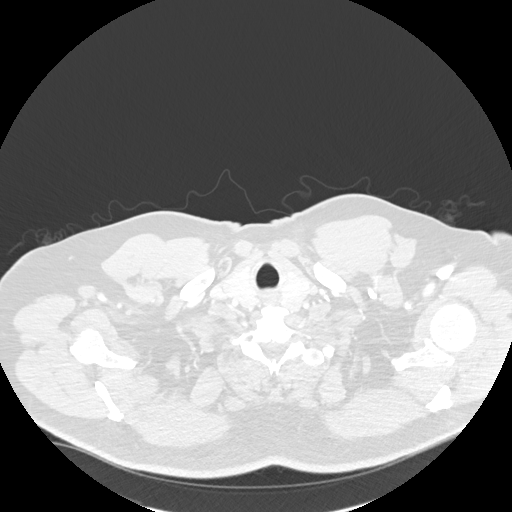

[Series 4: coronal chest · coronal · 0.73mm/px · 3 of 203 slices shown]
[im 41/203  lung]
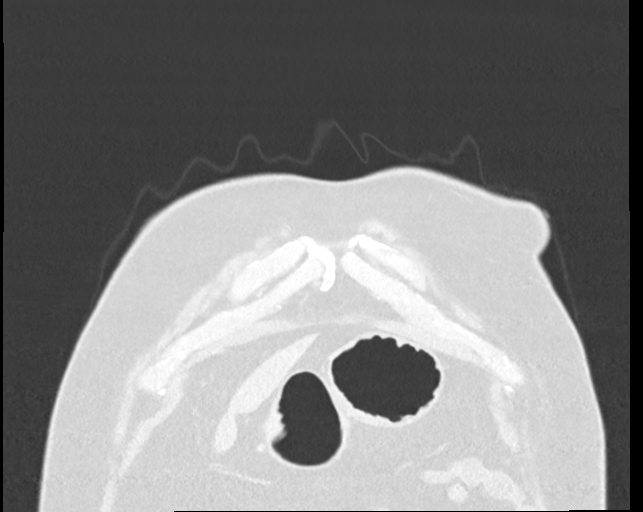
[im 81/203  lung]
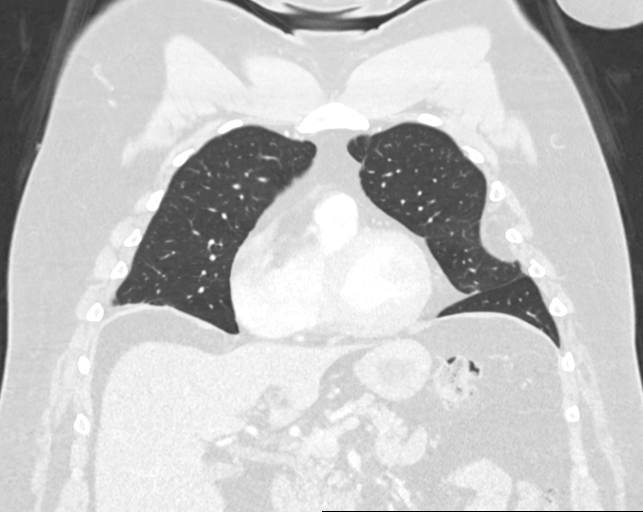
[im 122/203  lung]
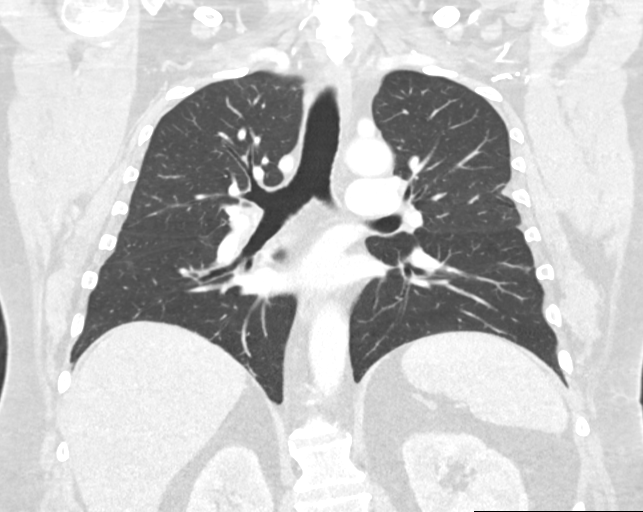

[15 of 36 positions shown; findings below may reference images not displayed]

FINDINGS: Cardiovascular: The heart is normal in size. No pericardial
effusion.

No evidence of thoracic aortic aneurysm.

Mediastinum/Nodes: No suspicious mediastinal lymphadenopathy.

18 mm left inferior thyroid nodule (series 2/image 28), unchanged.

Lungs/Pleura: Postsurgical changes related to prior wedge resection
in the superior segment left lower lobe (series 3/image 62). Mild
associated subpleural opacity.

Additional subpleural/peripheral opacities in the lungs bilaterally
have essentially resolved. Mild lingular scarring/atelectasis.

No suspicious pulmonary nodules.

Prominent subpleural fat in the anterior left hemithorax (series
2/image 88), benign.

No pleural effusion or pneumothorax.

Upper Abdomen: Visualized upper abdomen is grossly unremarkable.

Musculoskeletal: Degenerative changes of the visualized
thoracolumbar spine.
IMPRESSION: Postsurgical changes related to prior left lower lobe wedge
resection.

Additional pulmonary opacities have essentially resolved.

## 2019-10-26 DIAGNOSIS — E785 Hyperlipidemia, unspecified: Secondary | ICD-10-CM | POA: Diagnosis not present

## 2019-10-26 DIAGNOSIS — I10 Essential (primary) hypertension: Secondary | ICD-10-CM | POA: Diagnosis not present

## 2019-11-02 DIAGNOSIS — I10 Essential (primary) hypertension: Secondary | ICD-10-CM | POA: Diagnosis not present

## 2019-11-02 DIAGNOSIS — Z7901 Long term (current) use of anticoagulants: Secondary | ICD-10-CM | POA: Diagnosis not present

## 2019-11-02 DIAGNOSIS — E669 Obesity, unspecified: Secondary | ICD-10-CM | POA: Diagnosis not present

## 2019-11-02 DIAGNOSIS — Z86718 Personal history of other venous thrombosis and embolism: Secondary | ICD-10-CM | POA: Diagnosis not present

## 2019-11-02 DIAGNOSIS — Z6841 Body Mass Index (BMI) 40.0 and over, adult: Secondary | ICD-10-CM | POA: Diagnosis not present

## 2019-11-02 DIAGNOSIS — I872 Venous insufficiency (chronic) (peripheral): Secondary | ICD-10-CM | POA: Diagnosis not present

## 2019-11-02 DIAGNOSIS — E785 Hyperlipidemia, unspecified: Secondary | ICD-10-CM | POA: Diagnosis not present

## 2019-12-13 DIAGNOSIS — I10 Essential (primary) hypertension: Secondary | ICD-10-CM | POA: Diagnosis not present

## 2019-12-13 DIAGNOSIS — I8290 Acute embolism and thrombosis of unspecified vein: Secondary | ICD-10-CM | POA: Diagnosis not present

## 2019-12-13 DIAGNOSIS — E785 Hyperlipidemia, unspecified: Secondary | ICD-10-CM | POA: Diagnosis not present

## 2020-01-09 DIAGNOSIS — E785 Hyperlipidemia, unspecified: Secondary | ICD-10-CM | POA: Diagnosis not present

## 2020-01-09 DIAGNOSIS — I10 Essential (primary) hypertension: Secondary | ICD-10-CM | POA: Diagnosis not present

## 2020-01-09 DIAGNOSIS — Z1152 Encounter for screening for COVID-19: Secondary | ICD-10-CM | POA: Diagnosis not present

## 2020-01-09 DIAGNOSIS — J029 Acute pharyngitis, unspecified: Secondary | ICD-10-CM | POA: Diagnosis not present

## 2020-01-12 DIAGNOSIS — J019 Acute sinusitis, unspecified: Secondary | ICD-10-CM | POA: Diagnosis not present

## 2020-01-12 DIAGNOSIS — B9689 Other specified bacterial agents as the cause of diseases classified elsewhere: Secondary | ICD-10-CM | POA: Diagnosis not present

## 2020-01-12 DIAGNOSIS — J209 Acute bronchitis, unspecified: Secondary | ICD-10-CM | POA: Diagnosis not present

## 2020-01-12 DIAGNOSIS — U071 COVID-19: Secondary | ICD-10-CM | POA: Diagnosis not present

## 2020-01-12 DIAGNOSIS — Z03818 Encounter for observation for suspected exposure to other biological agents ruled out: Secondary | ICD-10-CM | POA: Diagnosis not present

## 2020-01-20 DIAGNOSIS — I10 Essential (primary) hypertension: Secondary | ICD-10-CM | POA: Diagnosis not present

## 2020-01-20 DIAGNOSIS — E785 Hyperlipidemia, unspecified: Secondary | ICD-10-CM | POA: Diagnosis not present

## 2020-02-14 DIAGNOSIS — I10 Essential (primary) hypertension: Secondary | ICD-10-CM | POA: Diagnosis not present

## 2020-02-14 DIAGNOSIS — E785 Hyperlipidemia, unspecified: Secondary | ICD-10-CM | POA: Diagnosis not present

## 2020-02-16 DIAGNOSIS — Z125 Encounter for screening for malignant neoplasm of prostate: Secondary | ICD-10-CM | POA: Diagnosis not present

## 2020-02-16 DIAGNOSIS — I10 Essential (primary) hypertension: Secondary | ICD-10-CM | POA: Diagnosis not present

## 2020-02-16 DIAGNOSIS — Z1159 Encounter for screening for other viral diseases: Secondary | ICD-10-CM | POA: Diagnosis not present

## 2020-02-16 DIAGNOSIS — Z1389 Encounter for screening for other disorder: Secondary | ICD-10-CM | POA: Diagnosis not present

## 2020-02-16 DIAGNOSIS — E669 Obesity, unspecified: Secondary | ICD-10-CM | POA: Diagnosis not present

## 2020-02-16 DIAGNOSIS — H6121 Impacted cerumen, right ear: Secondary | ICD-10-CM | POA: Diagnosis not present

## 2020-02-16 DIAGNOSIS — E785 Hyperlipidemia, unspecified: Secondary | ICD-10-CM | POA: Diagnosis not present

## 2020-02-16 DIAGNOSIS — Z8616 Personal history of COVID-19: Secondary | ICD-10-CM | POA: Diagnosis not present

## 2020-02-16 DIAGNOSIS — Z114 Encounter for screening for human immunodeficiency virus [HIV]: Secondary | ICD-10-CM | POA: Diagnosis not present

## 2020-02-16 DIAGNOSIS — Z0000 Encounter for general adult medical examination without abnormal findings: Secondary | ICD-10-CM | POA: Diagnosis not present

## 2020-02-16 DIAGNOSIS — Z23 Encounter for immunization: Secondary | ICD-10-CM | POA: Diagnosis not present

## 2020-02-21 DIAGNOSIS — E785 Hyperlipidemia, unspecified: Secondary | ICD-10-CM | POA: Diagnosis not present

## 2020-02-21 DIAGNOSIS — I10 Essential (primary) hypertension: Secondary | ICD-10-CM | POA: Diagnosis not present

## 2020-03-27 DIAGNOSIS — I10 Essential (primary) hypertension: Secondary | ICD-10-CM | POA: Diagnosis not present

## 2020-03-27 DIAGNOSIS — E785 Hyperlipidemia, unspecified: Secondary | ICD-10-CM | POA: Diagnosis not present

## 2020-05-02 DIAGNOSIS — E785 Hyperlipidemia, unspecified: Secondary | ICD-10-CM | POA: Diagnosis not present

## 2020-05-02 DIAGNOSIS — I10 Essential (primary) hypertension: Secondary | ICD-10-CM | POA: Diagnosis not present

## 2020-10-11 ENCOUNTER — Ambulatory Visit (INDEPENDENT_AMBULATORY_CARE_PROVIDER_SITE_OTHER): Payer: Medicare HMO | Admitting: Vascular Surgery

## 2021-01-03 ENCOUNTER — Ambulatory Visit
Admission: RE | Admit: 2021-01-03 | Discharge: 2021-01-03 | Disposition: A | Discharge status: Home / Self Care | Payer: Medicare Other | Source: Ambulatory Visit | Attending: Family Medicine | Admitting: Family Medicine

## 2021-01-03 ENCOUNTER — Other Ambulatory Visit: Payer: Self-pay | Admitting: Family Medicine

## 2021-01-03 DIAGNOSIS — M25571 Pain in right ankle and joints of right foot: Secondary | ICD-10-CM

## 2021-11-15 ENCOUNTER — Other Ambulatory Visit
Admission: RE | Admit: 2021-11-15 | Discharge: 2021-11-15 | Disposition: A | Discharge status: Home / Self Care | Payer: Medicare Other | Source: Ambulatory Visit | Attending: Pulmonary Disease | Admitting: Pulmonary Disease

## 2021-11-15 DIAGNOSIS — R0789 Other chest pain: Secondary | ICD-10-CM | POA: Insufficient documentation

## 2021-11-15 DIAGNOSIS — R0602 Shortness of breath: Secondary | ICD-10-CM | POA: Insufficient documentation

## 2021-11-15 DIAGNOSIS — R918 Other nonspecific abnormal finding of lung field: Secondary | ICD-10-CM | POA: Diagnosis present

## 2021-11-15 LAB — D-DIMER, QUANTITATIVE: D-Dimer, Quant: 0.44 ug/mL-FEU (ref 0.00–0.50)

## 2021-11-21 ENCOUNTER — Other Ambulatory Visit: Payer: Self-pay | Admitting: Pulmonary Disease

## 2021-11-21 DIAGNOSIS — R911 Solitary pulmonary nodule: Secondary | ICD-10-CM

## 2021-11-21 DIAGNOSIS — R0789 Other chest pain: Secondary | ICD-10-CM

## 2021-11-26 ENCOUNTER — Ambulatory Visit
Admission: RE | Admit: 2021-11-26 | Discharge: 2021-11-26 | Disposition: A | Discharge status: Home / Self Care | Payer: Medicare Other | Source: Ambulatory Visit | Attending: Pulmonary Disease | Admitting: Pulmonary Disease

## 2021-11-26 DIAGNOSIS — R0789 Other chest pain: Secondary | ICD-10-CM

## 2021-11-26 DIAGNOSIS — R911 Solitary pulmonary nodule: Secondary | ICD-10-CM

## 2021-11-26 MED ORDER — IOPAMIDOL (ISOVUE-370) INJECTION 76%
75.0000 mL | Freq: Once | INTRAVENOUS | Status: AC | PRN
Start: 2021-11-26 — End: 2021-11-26
  Administered 2021-11-26: 75 mL via INTRAVENOUS

## 2022-03-17 ENCOUNTER — Encounter (INDEPENDENT_AMBULATORY_CARE_PROVIDER_SITE_OTHER): Payer: Self-pay

## 2022-05-09 ENCOUNTER — Encounter (INDEPENDENT_AMBULATORY_CARE_PROVIDER_SITE_OTHER): Payer: Self-pay | Admitting: Ophthalmology

## 2022-05-09 ENCOUNTER — Ambulatory Visit (INDEPENDENT_AMBULATORY_CARE_PROVIDER_SITE_OTHER): Payer: Medicare Other | Admitting: Ophthalmology

## 2022-05-09 DIAGNOSIS — H43813 Vitreous degeneration, bilateral: Secondary | ICD-10-CM

## 2022-05-09 DIAGNOSIS — H35371 Puckering of macula, right eye: Secondary | ICD-10-CM | POA: Diagnosis not present

## 2022-05-09 DIAGNOSIS — H353131 Nonexudative age-related macular degeneration, bilateral, early dry stage: Secondary | ICD-10-CM | POA: Diagnosis not present

## 2022-05-09 DIAGNOSIS — H33322 Round hole, left eye: Secondary | ICD-10-CM

## 2022-05-09 DIAGNOSIS — H25813 Combined forms of age-related cataract, bilateral: Secondary | ICD-10-CM

## 2022-05-09 NOTE — Progress Notes (Signed)
[facility] Note  05/09/2022     CHIEF COMPLAINT Patient presents for Retina Evaluation   HISTORY OF PRESENT ILLNESS: John Jenkins is a 71 y.o. male who presents to the clinic today for:   HPI     Retina Evaluation   In left eye.  This started 1 day ago.  Duration of 1 day.  I, the attending physician,  performed the HPI with the patient and updated documentation appropriately.        Comments   Retina eval per Dr Jorja Loa for possible ret hole OS pt is reporting no vision changes noticed        Last edited by Bernarda Caffey, MD on 05/09/2022  4:43 PM.    Pt is here on the referral of Dr. Jorja Loa for concern of retinal hole OS, pt saw him for the first time for a routine eye exam yesterday, pt denies any previous eye sx, pt states he sees a floater in the right eye every month or 2, pt denies being hypertensive or diabetic, he denies taking any daily medication, he was on Eliquis and a BP med until about 3 months ago bc he had a blood clot in his leg  Referring physician: Madelin Headings, DO [address] [place],  [place] [address]  HISTORICAL INFORMATION:   Selected notes from the MEDICAL RECORD NUMBER Referred by Dr. Jorja Loa for concern of retinal hole LEE:  Ocular Hx- PMH-    CURRENT MEDICATIONS: No current outpatient medications on file. (Ophthalmic Drugs)   No current facility-administered medications for this visit. (Ophthalmic Drugs)   Current Outpatient Medications (Other)  Medication Sig   apixaban (ELIQUIS) 5 MG TABS tablet Take 65m daily   apixaban (ELIQUIS) 5 MG TABS tablet Take 1 tablet (5 mg total) by mouth 2 (two) times daily. (Patient taking differently: Take 2.5 mg by mouth 2 (two) times daily. )   Calcium-Magnesium-Zinc (CAL-MAG-ZINC PO) Take 1 tablet by mouth daily.   Cinnamon 500 MG capsule Take 500 mg by mouth daily.   COLLAGEN-VITAMIN C PO Take 1 tablet by mouth daily.   ELIQUIS 5 MG TABS tablet TAKE 1 TABLET BY MOUTH  TWICE A DAY   fluconazole (DIFLUCAN) 200 MG tablet Take 4 tablets (800 mg total) by mouth daily. (Patient not taking: Reported on 07/18/2019)   fluticasone (FLONASE) 50 MCG/ACT nasal spray Place 2 sprays into the nose daily.   levocetirizine (XYZAL) 5 MG tablet Take 5 mg by mouth every evening.   loratadine (CLARITIN) 10 MG tablet Take 10 mg by mouth at bedtime.   metoprolol tartrate (LOPRESSOR) 50 MG tablet Take 1 tablet (50 mg total) by mouth 2 (two) times daily.   Multiple Vitamin (MULTIVITAMIN WITH MINERALS) TABS tablet Take 1 tablet by mouth daily. Centrum Silver   Omega-3 Fatty Acids (FISH OIL PO) Take 1 capsule by mouth daily.    potassium gluconate 595 (99 K) MG TABS tablet Take 595 mg by mouth daily.   senna-docusate (SENOKOT-S) 8.6-50 MG tablet Take 1 tablet by mouth daily as needed for mild constipation.   traMADol (ULTRAM) 50 MG tablet Take 1-2 tablets (50-100 mg total) by mouth every 6 (six) hours. (Patient not taking: Reported on 10/12/2018)   vitamin B-12 (CYANOCOBALAMIN) 1000 MCG tablet Take 1,000 mcg by mouth daily.   vitamin C (ASCORBIC ACID) 500 MG tablet Take 500 mg by mouth daily.   zinc gluconate 50 MG tablet Take 50 mg by mouth daily.  No current facility-administered medications for this visit. (Other)   REVIEW OF SYSTEMS: ROS   Positive for: Eyes Last edited by Bernarda Caffey, MD on 05/09/2022  4:43 PM.     ALLERGIES No Known Allergies  PAST MEDICAL HISTORY Past Medical History:  Diagnosis Date   DVT (deep venous thrombosis) ([place]) 2019   Obese    Pneumonia    Seasonal allergies    Past Surgical History:  Procedure Laterality Date   ANKLE SURGERY  1974   broken arm  1970   COLONOSCOPY WITH PROPOFOL N/A 07/18/2019   Procedure: COLONOSCOPY WITH PROPOFOL;  Surgeon: Toledo, Benay Pike, MD;  Location: [facility];  Service: Gastroenterology;  Laterality: N/A;   IVC FILTER REMOVAL N/A 07/20/2018   Procedure: IVC FILTER REMOVAL;  Surgeon: Katha Cabal, MD;   Location: [facility];  Service: Cardiovascular;  Laterality: N/A;   PERIPHERAL VASCULAR THROMBECTOMY Left 05/04/2018   Procedure: PERIPHERAL VASCULAR THROMBECTOMY;  Surgeon: Katha Cabal, MD;  Location: [facility];  Service: Cardiovascular;  Laterality: Left;   VIDEO ASSISTED THORACOSCOPY (VATS)/THOROCOTOMY Left 06/30/2018   Procedure: VIDEO ASSISTED THORACOSCOPY (VATS)/THOROCOTOMY WITH LUNG BIOPSY-LEFT  PRE-OP BRONCH;  Surgeon: Nestor Lewandowsky, MD;  Location: [facility];  Service: General;  Laterality: Left;   FAMILY HISTORY Family History  Problem Relation Age of Onset   Heart disease Mother    Heart attack Father    SOCIAL HISTORY Social History   Tobacco Use   Smoking status: Never   Smokeless tobacco: Never  Vaping Use   Vaping Use: Never used  Substance Use Topics   Alcohol use: Never   Drug use: Never       OPHTHALMIC EXAM:  Base Eye Exam     Visual Acuity (Snellen - Linear)       Right Left   Dist cc 20/30 -2 20/25 -1   Dist ph cc NI     Correction: Glasses         Tonometry (Applanation, 9:23 AM)       Right Left   Pressure 16 16         Pupils       Pupils Dark Light Shape React APD   Right PERRL 3 2 Round Brisk None   Left PERRL 3 2 Round Brisk None         Visual Fields       Left Right    Full          Extraocular Movement       Right Left    Full, Ortho Full, Ortho         Neuro/Psych     Oriented x3: Yes   Mood/Affect: Normal           Slit Lamp and Fundus Exam     Slit Lamp Exam       Right Left   Lids/Lashes Dermatochalasis - upper lid Dermatochalasis - upper lid   Conjunctiva/Sclera temporal pinguecula temporal pinguecula   Cornea trace tear film debris mild tear film debris   Anterior Chamber deep and clear deep and clear   Iris Round and dilated Round and dilated   Lens 2+ Nuclear sclerosis, 2-3+ Cortical cataract 2+ Nuclear sclerosis, 2-3+ Cortical cataract   Anterior Vitreous mild  syneresis, Posterior vitreous detachment mild syneresis, Posterior vitreous detachment         Fundus Exam       Right Left   Disc Pink and Sharp, Compact Pink  and Sharp, Compact, mild PPA   C/D Ratio 0.5 0.4   Macula Flat, Blunted foveal reflex, mild ERM, no heme Flat, Good foveal reflex, RPE mottling, geographic area of hypopigmentation IN fovea and macula   Vessels attenuated, mild copper wiring, mild tortuosity attenuated, Tortuous   Periphery Attached, mild reticular degeneration Attached, operculated hole with surrounding pigment at 0800           Refraction     Wearing Rx       Sphere Cylinder Axis Add   Right -3.00 +2.25 058 +2.50   Left -4.25 +3.25 082 +2.50           IMAGING AND PROCEDURES  Imaging and Procedures for 05/09/2022  OCT, Retina - OU - Both Eyes       Right Eye Quality was good. Central Foveal Thickness: 406. Progression has no prior data. Findings include no SRF, abnormal foveal contour, retinal drusen , epiretinal membrane, intraretinal fluid, macular pucker (Focal ERM with pucker, retinal thickening and cystic changes nasal fovea and mac, rare fine drusen).   Left Eye Quality was good. Central Foveal Thickness: 282. Progression has no prior data. Findings include normal foveal contour, no IRF, no SRF, retinal drusen , outer retinal atrophy (Focal decrease in ellipsoid signal IN fovea).   Notes *Images captured and stored on drive  Diagnosis / Impression:  OD: Focal ERM with pucker, retinal thickening and cystic changes nasal fovea and mac, rare fine drusen OS: Focal decrease in ellipsoid signal IN fovea  Clinical management:  See below  Abbreviations: NFP - Normal foveal profile. CME - cystoid macular edema. PED - pigment epithelial detachment. IRF - intraretinal fluid. SRF - subretinal fluid. EZ - ellipsoid zone. ERM - epiretinal membrane. ORA - outer retinal atrophy. ORT - outer retinal tubulation. SRHM - subretinal hyper-reflective  material. IRHM - intraretinal hyper-reflective material      Repair Retinal Breaks, Laser - OS - Left Eye       LASER PROCEDURE NOTE  Procedure:  Barrier laser retinopexy using slit lamp laser, left eye   Diagnosis:   Retinal hole, left eye                     Operculated hole at 8 o'clock anterior to equator   Surgeon: Bernarda Caffey, MD, PhD  Anesthesia: Topical  Informed consent obtained, operative eye marked, and time out performed prior to initiation of laser.   Laser settings:  Lumenis Smart532 laser, slit lamp Lens: [place]ster PRP 165 Power: 300 mW Spot size: 200 microns Duration: 30 msec  # spots: 167  Placement of laser: Using a Mainster PRP 165 contact lens at the slit lamp, laser was placed in three confluent rows around operculated hole at 8 oclock anterior to equator.  Complications: None.  Patient tolerated the procedure well and received written and verbal post-procedure care information/education.            ASSESSMENT/PLAN:    ICD-10-CM   1. Retinal hole of left eye  H33.322 Repair Retinal Breaks, Laser - OS - Left Eye    2. Epiretinal membrane (ERM) of right eye  H35.371 OCT, Retina - OU - Both Eyes    3. Posterior vitreous detachment of both eyes  H43.813     4. Early dry stage nonexudative age-related macular degeneration of both eyes  H35.3131     5. Combined forms of age-related cataract of both eyes  H25.813      Retinal hole, OS   -  The incidence, risk factors, and natural history of retinal tear was discussed with patient.   - Potential treatment options including laser retinopexy and cryotherapy discussed with patient. - operculated hole with surrounding pigment at 0800 - asymptomatic - recommend laser retinopexy OS today, 12.22.23 - pt wishes to proceed with laser - RBA of procedure discussed, questions answered - informed consent obtained and signed - see procedure note - start Lotemax OS QID x7 days - f/u in 2-3 wks, DFE,  OCT  2. Epiretinal membrane, right eye  - The natural history, anatomy, potential for loss of vision, and treatment options including vitrectomy techniques and the complications of endophthalmitis, retinal detachment, vitreous hemorrhage, cataract progression and permanent vision loss discussed with the patient. - mild ERM -- focal to nasal macula and fovea - BCVA 20/30 - asymptomatic, no metamorphopsia - no indication for surgery at this time - monitor for now - f/u 3 mos -- DFE/OCT  3. Age related macular degeneration, non-exudative, both eyes  - The incidence, anatomy, and pathology of dry AMD, risk of progression, and the AREDS and AREDS 2 study including smoking risks discussed with patient.  - Recommend amsler grid monitoring  - f/u 3 months, DFE, OCT  4. PVD OU  - pt with mildly symptomatic floater OD  - Discussed findings and prognosis  - No RT or RD OU; operculated hole OS as abaove  - Reviewed s/s of RT/RD  - Strict return precautions for any such RT/RD signs/symptoms  5. Mixed Cataract OU - The symptoms of cataract, surgical options, and treatments and risks were discussed with patient. - discussed diagnosis and progression - not yet visually significant - monitor for now  Ophthalmic Meds Ordered this visit:  No orders of the defined types were placed in this encounter.    Return for f/u 2-3 weeks, retinal hole OS, DFE, OCT.  There are no Patient Instructions on file for this visit.   Explained the diagnoses, plan, and follow up with the patient and they expressed understanding.  Patient expressed understanding of the importance of proper follow up care.   This document serves as a record of services personally performed by Gardiner Sleeper, MD, PhD. It was created on their behalf by San Jetty. Owens Shark, OA an ophthalmic technician. The creation of this record is the provider's dictation and/or activities during the visit.    Electronically signed by: San Jetty. Owens Shark,  [place] 12.22.2023 4:48 PM  Gardiner Sleeper, M.D., Ph.D. Diseases & Surgery of the Retina and Vitreous [facility]  I have reviewed the above documentation for accuracy and completeness, and I agree with the above. Gardiner Sleeper, M.D., Ph.D. 05/09/22 4:48 PM  Abbreviations: M myopia (nearsighted); A astigmatism; H hyperopia (farsighted); P presbyopia; Mrx spectacle prescription;  CTL contact lenses; OD right eye; OS left eye; OU both eyes  XT exotropia; ET esotropia; PEK punctate epithelial keratitis; PEE punctate epithelial erosions; DES dry eye syndrome; MGD meibomian gland dysfunction; ATs artificial tears; PFAT's preservative free artificial tears; [place] nuclear sclerotic cataract; PSC posterior subcapsular cataract; ERM epi-retinal membrane; PVD posterior vitreous detachment; RD retinal detachment; DM diabetes mellitus; DR diabetic retinopathy; NPDR non-proliferative diabetic retinopathy; PDR proliferative diabetic retinopathy; CSME clinically significant macular edema; DME diabetic macular edema; dbh dot blot hemorrhages; CWS cotton wool spot; POAG primary open angle glaucoma; C/D cup-to-disc ratio; HVF humphrey visual field; GVF goldmann visual field; OCT optical coherence tomography; IOP intraocular pressure; BRVO Branch retinal vein occlusion; CRVO central  retinal vein occlusion; CRAO central retinal artery occlusion; BRAO branch retinal artery occlusion; RT retinal tear; SB scleral buckle; PPV pars plana vitrectomy; VH Vitreous hemorrhage; PRP panretinal laser photocoagulation; IVK intravitreal kenalog; VMT vitreomacular traction; MH Macular hole;  NVD neovascularization of the disc; NVE neovascularization elsewhere; AREDS age related eye disease study; ARMD age related macular degeneration; POAG primary open angle glaucoma; EBMD epithelial/anterior basement membrane dystrophy; ACIOL anterior chamber intraocular lens; IOL intraocular lens; PCIOL posterior chamber intraocular  lens; Phaco/IOL phacoemulsification with intraocular lens placement; [place] photorefractive keratectomy; LASIK laser assisted in situ keratomileusis; HTN hypertension; DM diabetes mellitus; COPD chronic obstructive pulmonary disease

## 2022-05-28 NOTE — Progress Notes (Signed)
[facility] Note  05/30/2022     CHIEF COMPLAINT Patient presents for Retina Follow Up and Anorexia   HISTORY OF PRESENT ILLNESS: John Jenkins is a 72 y.o. male who presents to the clinic today for:   HPI     Retina Follow Up   Patient presents with  Other.  In left eye.  This started weeks ago.  Duration of 3 weeks.  I, the attending physician,  performed the HPI with the patient and updated documentation appropriately.        Comments   Patient feels that the vision is the same. Patient has used up all the eye drops after the laser.       Last edited by Bernarda Caffey, MD on 05/30/2022  4:15 PM.    Patient states that everything is good. He feels that he is seeing well.   Referring physician: Houston Wright,  [place] [address]  HISTORICAL INFORMATION:   Selected notes from the MEDICAL RECORD NUMBER Referred by Dr. Jorja Loa for concern of retinal hole LEE:  Ocular Hx- PMH-    CURRENT MEDICATIONS: No current outpatient medications on file. (Ophthalmic Drugs)   No current facility-administered medications for this visit. (Ophthalmic Drugs)   Current Outpatient Medications (Other)  Medication Sig   apixaban (ELIQUIS) 5 MG TABS tablet Take 59m daily   apixaban (ELIQUIS) 5 MG TABS tablet Take 1 tablet (5 mg total) by mouth 2 (two) times daily. (Patient taking differently: Take 2.5 mg by mouth 2 (two) times daily. )   Calcium-Magnesium-Zinc (CAL-MAG-ZINC PO) Take 1 tablet by mouth daily.   Cinnamon 500 MG capsule Take 500 mg by mouth daily.   COLLAGEN-VITAMIN C PO Take 1 tablet by mouth daily.   ELIQUIS 5 MG TABS tablet TAKE 1 TABLET BY MOUTH TWICE A DAY   fluconazole (DIFLUCAN) 200 MG tablet Take 4 tablets (800 mg total) by mouth daily. (Patient not taking: Reported on 07/18/2019)   fluticasone (FLONASE) 50 MCG/ACT nasal spray Place 2 sprays into the nose daily.   levocetirizine (XYZAL) 5 MG tablet Take 5 mg by  mouth every evening.   loratadine (CLARITIN) 10 MG tablet Take 10 mg by mouth at bedtime.   metoprolol tartrate (LOPRESSOR) 50 MG tablet Take 1 tablet (50 mg total) by mouth 2 (two) times daily.   Multiple Vitamin (MULTIVITAMIN WITH MINERALS) TABS tablet Take 1 tablet by mouth daily. Centrum Silver   Omega-3 Fatty Acids (FISH OIL PO) Take 1 capsule by mouth daily.    potassium gluconate 595 (99 K) MG TABS tablet Take 595 mg by mouth daily.   senna-docusate (SENOKOT-S) 8.6-50 MG tablet Take 1 tablet by mouth daily as needed for mild constipation.   traMADol (ULTRAM) 50 MG tablet Take 1-2 tablets (50-100 mg total) by mouth every 6 (six) hours. (Patient not taking: Reported on 10/12/2018)   vitamin B-12 (CYANOCOBALAMIN) 1000 MCG tablet Take 1,000 mcg by mouth daily.   vitamin C (ASCORBIC ACID) 500 MG tablet Take 500 mg by mouth daily.   zinc gluconate 50 MG tablet Take 50 mg by mouth daily.   No current facility-administered medications for this visit. (Other)   REVIEW OF SYSTEMS: ROS   Positive for: Eyes Last edited by SAnnie Paras COT on 05/30/2022  9:21 AM.     ALLERGIES No Known Allergies  PAST MEDICAL HISTORY Past Medical History:  Diagnosis Date   [place]  Obese    Pneumonia    Seasonal allergies    Past Surgical History:  Procedure Laterality Date   ANKLE SURGERY  1974   broken arm  1970   COLONOSCOPY WITH PROPOFOL N/A 07/18/2019   Procedure: COLONOSCOPY WITH PROPOFOL;  Surgeon: Toledo, Benay Pike, MD;  Location: [facility];  Service: Gastroenterology;  Laterality: N/A;   IVC FILTER REMOVAL N/A 07/20/2018   Procedure: IVC FILTER REMOVAL;  Surgeon: Katha Cabal, MD;  Location: [facility];  Service: Cardiovascular;  Laterality: N/A;   PERIPHERAL VASCULAR THROMBECTOMY Left 05/04/2018   Procedure: PERIPHERAL VASCULAR THROMBECTOMY;  Surgeon: Katha Cabal, MD;  Location: [facility];  Service: Cardiovascular;   Laterality: Left;   VIDEO ASSISTED THORACOSCOPY (VATS)/THOROCOTOMY Left 06/30/2018   Procedure: VIDEO ASSISTED THORACOSCOPY (VATS)/THOROCOTOMY WITH LUNG BIOPSY-LEFT  PRE-OP BRONCH;  Surgeon: Nestor Lewandowsky, MD;  Location: [facility];  Service: General;  Laterality: Left;   FAMILY HISTORY Family History  Problem Relation Age of Onset   Heart disease Mother    Heart attack Father    SOCIAL HISTORY Social History   Tobacco Use   Smoking status: Never   Smokeless tobacco: Never  Vaping Use   Vaping Use: Never used  Substance Use Topics   Alcohol use: Never   Drug use: Never       OPHTHALMIC EXAM:  Base Eye Exam     Visual Acuity (Snellen - Linear)       Right Left   Dist cc 20/25 20/25    Correction: Glasses         Tonometry (Tonopen, 9:26 AM)       Right Left   Pressure 18 20         Pupils       Dark Light Shape React APD   Right 3 2 Round Brisk None   Left 3 2 Round Brisk None         Visual Fields       Left Right    Full Full         Extraocular Movement       Right Left    Full, Ortho Full, Ortho         Neuro/Psych     Oriented x3: Yes   Mood/Affect: Normal         Dilation     Both eyes: 1.0% Mydriacyl, 2.5% Phenylephrine @ 9:23 AM           Slit Lamp and Fundus Exam     Slit Lamp Exam       Right Left   Lids/Lashes Dermatochalasis - upper lid Dermatochalasis - upper lid   Conjunctiva/Sclera temporal pinguecula temporal pinguecula   Cornea trace tear film debris mild tear film debris   Anterior Chamber deep and clear deep and clear   Iris Round and dilated Round and dilated   Lens 2+ Nuclear sclerosis, 2-3+ Cortical cataract 2+ Nuclear sclerosis, 2-3+ Cortical cataract   Anterior Vitreous mild syneresis, Posterior vitreous detachment mild syneresis, Posterior vitreous detachment         Fundus Exam       Right Left   Disc Pink and Sharp, Compact Pink and Sharp, Compact, mild PPA   C/D Ratio 0.5 0.4   Macula  Flat, Blunted foveal reflex, mild ERM, no heme Flat, Good foveal reflex, RPE mottling, geographic area of hypopigmentation IN fovea and macula   Vessels attenuated, mild copper wiring, mild tortuosity attenuated, Tortuous  Periphery Attached, mild reticular degeneration Attached, operculated hole with surrounding pigment at 0800 --now with good laser surronding, mild reticular degeneration           Refraction     Wearing Rx       Sphere Cylinder Axis Add   Right -3.00 +2.25 058 +2.50   Left -4.25 +3.25 082 +2.50           IMAGING AND PROCEDURES  Imaging and Procedures for 05/30/2022  OCT, Retina - OU - Both Eyes       Right Eye Quality was good. Central Foveal Thickness: 400. Progression has been stable. Findings include no SRF, abnormal foveal contour, retinal drusen , epiretinal membrane, intraretinal fluid, macular pucker (Focal ERM with pucker, retinal thickening and cystic changes nasal fovea and mac, rare fine drusen).   Left Eye Quality was good. Central Foveal Thickness: 284. Progression has been stable. Findings include normal foveal contour, no IRF, no SRF, retinal drusen , outer retinal atrophy (Focal decrease in ellipsoid signal IN fovea).   Notes *Images captured and stored on drive  Diagnosis / Impression:  OD: Focal ERM with pucker, retinal thickening and cystic changes nasal fovea and mac, rare fine drusen OS: Focal decrease in ellipsoid signal IN fovea -- stable  Clinical management:  See below  Abbreviations: NFP - Normal foveal profile. CME - cystoid macular edema. PED - pigment epithelial detachment. IRF - intraretinal fluid. SRF - subretinal fluid. EZ - ellipsoid zone. ERM - epiretinal membrane. ORA - outer retinal atrophy. ORT - outer retinal tubulation. SRHM - subretinal hyper-reflective material. IRHM - intraretinal hyper-reflective material            ASSESSMENT/PLAN:    ICD-10-CM   1. Retinal hole of left eye  H33.322     2.  Epiretinal membrane (ERM) of right eye  H35.371 OCT, Retina - OU - Both Eyes    3. Posterior vitreous detachment of both eyes  H43.813     4. Early dry stage nonexudative age-related macular degeneration of both eyes  H35.3131     5. Combined forms of age-related cataract of both eyes  H25.813      Retinal hole, OS   - operculated hole with surrounding pigment at 0800 - s/p Laser retinopexy (12.22.23) -- good laser changes in place - completed Lotemax OS QID x7 days - f/u in 3 mos, DFE, OCT  2. Epiretinal membrane, right eye  - The natural history, anatomy, potential for loss of vision, and treatment options including vitrectomy techniques and the complications of endophthalmitis, retinal detachment, vitreous hemorrhage, cataract progression and permanent vision loss discussed with the patient. - mild ERM -- focal to nasal macula and fovea - BCVA 20/25 improved from 20/30 - asymptomatic, no metamorphopsia - no indication for surgery at this time - monitor for now - f/u 3 months DFE, OCT  3. Age related macular degeneration, non-exudative, both eyes - The incidence, anatomy, and pathology of dry AMD, risk of progression, and the AREDS and AREDS 2 study including smoking risks discussed with patient.  - Recommend amsler grid monitoring  - f/u 3 months DFE, OCT  4. PVD OU  - pt with mildly symptomatic floater OD  - Discussed findings and prognosis  - No RT or RD OU; operculated hole OS as abaove  - Reviewed s/s of RT/RD  - strict return precautions for any such RT/RD symptoms  5. Mixed Cataract OU - The symptoms of cataract, surgical options, and treatments and risks  were discussed with patient. - discussed diagnosis and progression - not yet visually significant  - monitor for now  Ophthalmic Meds Ordered this visit:  No orders of the defined types were placed in this encounter.    Return in about 3 months (around 08/29/2022) for f/u Retinal Hole OS, ERM OD, DFE,  OCT.  There are no Patient Instructions on file for this visit.   Explained the diagnoses, plan, and follow up with the patient and they expressed understanding.  Patient expressed understanding of the importance of proper follow up care.   This document serves as a record of services personally performed by Gardiner Sleeper, MD, PhD. It was created on their behalf by Roselee Nova, COMT. The creation of this record is the provider's dictation and/or activities during the visit.  Electronically signed by: Roselee Nova, COMT 05/30/22 4:16 PM  This document serves as a record of services personally performed by Gardiner Sleeper, MD, PhD. It was created on their behalf by Renaldo Reel, [place] an ophthalmic technician. The creation of this record is the provider's dictation and/or activities during the visit.    Electronically signed by:  Renaldo Reel, COT  1.12.24 4:16 PM   Gardiner Sleeper, M.D., Ph.D. Diseases & Surgery of the Retina and Vitreous [facility]  I have reviewed the above documentation for accuracy and completeness, and I agree with the above. Gardiner Sleeper, M.D., Ph.D. 05/30/22 4:17 PM  Abbreviations: M myopia (nearsighted); A astigmatism; H hyperopia (farsighted); P presbyopia; Mrx spectacle prescription;  CTL contact lenses; OD right eye; OS left eye; OU both eyes  XT exotropia; ET esotropia; PEK punctate epithelial keratitis; PEE punctate epithelial erosions; DES dry eye syndrome; MGD meibomian gland dysfunction; ATs artificial tears; PFAT's preservative free artificial tears; [place] nuclear sclerotic cataract; PSC posterior subcapsular cataract; ERM epi-retinal membrane; PVD posterior vitreous detachment; RD retinal detachment; DM diabetes mellitus; DR diabetic retinopathy; NPDR non-proliferative diabetic retinopathy; PDR proliferative diabetic retinopathy; CSME clinically significant macular edema; DME diabetic macular edema; dbh dot blot  hemorrhages; CWS cotton wool spot; POAG primary open angle glaucoma; C/D cup-to-disc ratio; HVF humphrey visual field; GVF goldmann visual field; OCT optical coherence tomography; IOP intraocular pressure; BRVO Branch retinal vein occlusion; CRVO central retinal vein occlusion; CRAO central retinal artery occlusion; BRAO branch retinal artery occlusion; RT retinal tear; SB scleral buckle; PPV pars plana vitrectomy; VH Vitreous hemorrhage; PRP panretinal laser photocoagulation; IVK intravitreal kenalog; VMT vitreomacular traction; MH Macular hole;  NVD neovascularization of the disc; NVE neovascularization elsewhere; AREDS age related eye disease study; ARMD age related macular degeneration; POAG primary open angle glaucoma; EBMD epithelial/anterior basement membrane dystrophy; ACIOL anterior chamber intraocular lens; IOL intraocular lens; PCIOL posterior chamber intraocular lens; Phaco/IOL phacoemulsification with intraocular lens placement; [place] photorefractive keratectomy; LASIK laser assisted in situ keratomileusis; HTN hypertension; DM diabetes mellitus; COPD chronic obstructive pulmonary disease

## 2022-05-30 ENCOUNTER — Encounter (INDEPENDENT_AMBULATORY_CARE_PROVIDER_SITE_OTHER): Payer: Self-pay | Admitting: Ophthalmology

## 2022-05-30 ENCOUNTER — Ambulatory Visit (INDEPENDENT_AMBULATORY_CARE_PROVIDER_SITE_OTHER): Payer: Medicare Other | Admitting: Ophthalmology

## 2022-05-30 DIAGNOSIS — H33322 Round hole, left eye: Secondary | ICD-10-CM | POA: Diagnosis not present

## 2022-05-30 DIAGNOSIS — H353131 Nonexudative age-related macular degeneration, bilateral, early dry stage: Secondary | ICD-10-CM | POA: Diagnosis not present

## 2022-05-30 DIAGNOSIS — H35371 Puckering of macula, right eye: Secondary | ICD-10-CM

## 2022-05-30 DIAGNOSIS — H43813 Vitreous degeneration, bilateral: Secondary | ICD-10-CM | POA: Diagnosis not present

## 2022-05-30 DIAGNOSIS — H25813 Combined forms of age-related cataract, bilateral: Secondary | ICD-10-CM

## 2022-08-26 NOTE — Progress Notes (Signed)
[facility] - Clinic Note  08/29/2022     CHIEF COMPLAINT Patient presents for Retina Follow Up   HISTORY OF PRESENT ILLNESS: John Jenkins is a 72 y.o. male who presents to the clinic today for:   HPI     Retina Follow Up   Patient presents with  Other.  In left eye.  Severity is moderate.  Duration of 3 months.  Since onset it is gradually worsening.  I, the attending physician,  performed the HPI with the patient and updated documentation appropriately.        Comments   Pt here for 3 mo ret f/u for ret hole OS/ERM OD. Pt states VA OD remains blurry, feels like it could be worse but its hard to tell. Reports OS stable. He did receive new rx specs but they havent seemed to help much.       Last edited by Rennis Chris, MD on 08/29/2022 12:53 PM.    Patient states left eye is "great", right eye has a little bit of distortion  Referring physician: [facility] [address] [place],  [place] [address]  HISTORICAL INFORMATION:   Selected notes from the MEDICAL RECORD NUMBER Referred by Dr. Charise Killian for concern of retinal hole LEE:  Ocular Hx- PMH-    CURRENT MEDICATIONS: No current outpatient medications on file. (Ophthalmic Drugs)   No current facility-administered medications for this visit. (Ophthalmic Drugs)   Current Outpatient Medications (Other)  Medication Sig   Calcium-Magnesium-Zinc (CAL-MAG-ZINC PO) Take 1 tablet by mouth daily.   Cinnamon 500 MG capsule Take 500 mg by mouth daily.   COLLAGEN-VITAMIN C PO Take 1 tablet by mouth daily.   Multiple Vitamin (MULTIVITAMIN WITH MINERALS) TABS tablet Take 1 tablet by mouth daily. Centrum Silver   Omega-3 Fatty Acids (FISH OIL PO) Take 1 capsule by mouth daily.    potassium gluconate 595 (99 K) MG TABS tablet Take 595 mg by mouth daily.   vitamin C (ASCORBIC ACID) 500 MG tablet Take 500 mg by mouth daily.   zinc gluconate 50 MG tablet Take 50 mg by mouth daily.   apixaban (ELIQUIS)  5 MG TABS tablet Take 5mg  daily   apixaban (ELIQUIS) 5 MG TABS tablet Take 1 tablet (5 mg total) by mouth 2 (two) times daily. (Patient taking differently: Take 2.5 mg by mouth 2 (two) times daily. )   ELIQUIS 5 MG TABS tablet TAKE 1 TABLET BY MOUTH TWICE A DAY   fluconazole (DIFLUCAN) 200 MG tablet Take 4 tablets (800 mg total) by mouth daily. (Patient not taking: Reported on 07/18/2019)   fluticasone (FLONASE) 50 MCG/ACT nasal spray Place 2 sprays into the nose daily.   levocetirizine (XYZAL) 5 MG tablet Take 5 mg by mouth every evening.   loratadine (CLARITIN) 10 MG tablet Take 10 mg by mouth at bedtime.   metoprolol tartrate (LOPRESSOR) 50 MG tablet Take 1 tablet (50 mg total) by mouth 2 (two) times daily.   senna-docusate (SENOKOT-S) 8.6-50 MG tablet Take 1 tablet by mouth daily as needed for mild constipation.   traMADol (ULTRAM) 50 MG tablet Take 1-2 tablets (50-100 mg total) by mouth every 6 (six) hours. (Patient not taking: Reported on 10/12/2018)   vitamin B-12 (CYANOCOBALAMIN) 1000 MCG tablet Take 1,000 mcg by mouth daily.   No current facility-administered medications for this visit. (Other)   REVIEW OF SYSTEMS: ROS   Positive for: HENT, Cardiovascular, Eyes Negative for: Constitutional, Gastrointestinal, Neurological, Skin, Genitourinary,  Musculoskeletal, Endocrine, Respiratory, Psychiatric, Allergic/Imm, Heme/Lymph Last edited by Thompson Grayer, COT on 08/29/2022  8:24 AM.     ALLERGIES No Known Allergies  PAST MEDICAL HISTORY Past Medical History:  Diagnosis Date   DVT (deep venous thrombosis) 2019   Obese    Pneumonia    Seasonal allergies    Past Surgical History:  Procedure Laterality Date   ANKLE SURGERY  1974   broken arm  1970   COLONOSCOPY WITH PROPOFOL N/A 07/18/2019   Procedure: COLONOSCOPY WITH PROPOFOL;  Surgeon: Toledo, Boykin Nearing, MD;  Location: [facility] ENDOSCOPY;  Service: Gastroenterology;  Laterality: N/A;   IVC FILTER REMOVAL N/A 07/20/2018   Procedure:  IVC FILTER REMOVAL;  Surgeon: Renford Dills, MD;  Location: [facility] INVASIVE CV LAB;  Service: Cardiovascular;  Laterality: N/A;   PERIPHERAL VASCULAR THROMBECTOMY Left 05/04/2018   Procedure: PERIPHERAL VASCULAR THROMBECTOMY;  Surgeon: Renford Dills, MD;  Location: [facility] INVASIVE CV LAB;  Service: Cardiovascular;  Laterality: Left;   VIDEO ASSISTED THORACOSCOPY (VATS)/THOROCOTOMY Left 06/30/2018   Procedure: VIDEO ASSISTED THORACOSCOPY (VATS)/THOROCOTOMY WITH LUNG BIOPSY-LEFT  PRE-OP BRONCH;  Surgeon: Hulda Marin, MD;  Location: [facility] ORS;  Service: General;  Laterality: Left;   FAMILY HISTORY Family History  Problem Relation Age of Onset   Heart disease Mother    Heart attack Father    SOCIAL HISTORY Social History   Tobacco Use   Smoking status: Never   Smokeless tobacco: Never  Vaping Use   Vaping Use: Never used  Substance Use Topics   Alcohol use: Never   Drug use: Never       OPHTHALMIC EXAM:  Base Eye Exam     Visual Acuity (Snellen - Linear)       Right Left   Dist cc 20/30 -2 20/25 -1   Dist ph cc 20/25 -2     Correction: Glasses         Tonometry (Tonopen, 8:46 AM)       Right Left   Pressure 12 15         Pupils       Pupils Dark Light Shape React APD   Right PERRL 3 2 Round Brisk None   Left PERRL 3 2 Round Brisk None         Visual Fields (Counting fingers)       Left Right    Full Full         Extraocular Movement       Right Left    Full, Ortho Full, Ortho         Neuro/Psych     Oriented x3: Yes   Mood/Affect: Normal         Dilation     Both eyes: 1.0% Mydriacyl, 2.5% Phenylephrine @ 8:47 AM           Slit Lamp and Fundus Exam     Slit Lamp Exam       Right Left   Lids/Lashes Dermatochalasis - upper lid Dermatochalasis - upper lid   Conjunctiva/Sclera temporal pinguecula temporal pinguecula   Cornea trace PEE trace PEE   Anterior Chamber deep and clear deep and clear   Iris Round and dilated  Round and dilated   Lens 2+ Nuclear sclerosis, 2-3+ Cortical cataract 2+ Nuclear sclerosis, 2-3+ Cortical cataract   Anterior Vitreous mild syneresis, Posterior vitreous detachment mild syneresis, Posterior vitreous detachment         Fundus Exam       Right Left  Disc Pink and Sharp, Compact, mild PPA Pink and Sharp, Compact, mild PPA   C/D Ratio 0.5 0.4   Macula Flat, Blunted foveal reflex, mild ERM with striae and central thickening, no heme Flat, Good foveal reflex, RPE mottling and central clumping, geographic area of hypopigmentation IN fovea and macula   Vessels attenuated, mild tortuosity, mild copper wiring attenuated, Tortuous   Periphery Attached, mild reticular degeneration, No heme Attached, operculated hole with surrounding pigment at 0800 --now with good laser surronding, mild reticular degeneration, no new RT/RD or lattice           Refraction     Wearing Rx       Sphere Cylinder Axis Add   Right --4.00 +2.25 081 +2.50   Left -5.72 +3.25 091 +2.50           IMAGING AND PROCEDURES  Imaging and Procedures for 08/29/2022  OCT, Retina - OU - Both Eyes       Right Eye Quality was good. Central Foveal Thickness: 412. Progression has been stable. Findings include no SRF, abnormal foveal contour, retinal drusen , epiretinal membrane, intraretinal fluid, macular pucker (Focal ERM with pucker, retinal thickening and cystic changes nasal fovea and mac, rare fine drusen).   Left Eye Quality was good. Central Foveal Thickness: 283. Progression has been stable. Findings include normal foveal contour, no IRF, no SRF, retinal drusen , outer retinal atrophy (Focal decrease in ellipsoid signal IN fovea).   Notes *Images captured and stored on drive  Diagnosis / Impression:  OD: Focal ERM with pucker, retinal thickening and cystic changes nasal fovea and mac, rare fine drusen OS: Focal decrease in ellipsoid signal IN fovea -- stable  Clinical management:  See  below  Abbreviations: NFP - Normal foveal profile. CME - cystoid macular edema. PED - pigment epithelial detachment. IRF - intraretinal fluid. SRF - subretinal fluid. EZ - ellipsoid zone. ERM - epiretinal membrane. ORA - outer retinal atrophy. ORT - outer retinal tubulation. SRHM - subretinal hyper-reflective material. IRHM - intraretinal hyper-reflective material            ASSESSMENT/PLAN:    ICD-10-CM   1. Epiretinal membrane (ERM) of right eye  H35.371 OCT, Retina - OU - Both Eyes    2. Retinal hole of left eye  H33.322 OCT, Retina - OU - Both Eyes    3. Posterior vitreous detachment of both eyes  H43.813     4. Early dry stage nonexudative age-related macular degeneration of both eyes  H35.3131     5. Combined forms of age-related cataract of both eyes  H25.813      1 . Epiretinal membrane, right eye - stable - mild focal ERM -- nasal macula and fovea - BCVA stable at 20/25  - asymptomatic, no significant metamorphopsia -- just generalized blur - no indication for surgery at this time - monitor for now - f/u 6 months DFE, OCT  2. Retinal hole, OS   - operculated hole with surrounding pigment at 0800 - s/p laser retinopexy (12.22.23) -- good laser changes in place - no new RT/RD - monitor  3. Age related macular degeneration, non-exudative, both eyes - The incidence, anatomy, and pathology of dry AMD, risk of progression, and the AREDS and AREDS 2 study including smoking risks discussed with patient.  - Recommend amsler grid monitoring  - f/u 3 months DFE, OCT  4. PVD OU  - pt with mildly symptomatic floater OD  - Discussed findings and prognosis  -  No RT or RD OU; operculated hole OS as abaove  - Reviewed s/s of RT/RD  - strict return precautions for any such RT/RD symptoms  5. Mixed Cataract OU - The symptoms of cataract, surgical options, and treatments and risks were discussed with patient. - discussed diagnosis and progression - not yet visually  significant  - monitor for now  Ophthalmic Meds Ordered this visit:  No orders of the defined types were placed in this encounter.    Return in about 6 months (around 02/28/2023) for f/u ERM OD, DFE, OCT.  There are no Patient Instructions on file for this visit.  This document serves as a record of services personally performed by Karie ChimeraBrian G. Jaylah Goodlow, MD, PhD. It was created on their behalf by Berlin HunJennifer Mendenhall COT, an ophthalmic technician. The creation of this record is the provider's dictation and/or activities during the visit.    Electronically signed by: Berlin HunJennifer Mendenhall COT 04/09/202412:53 PM  This document serves as a record of services personally performed by Karie ChimeraBrian G. Charlean Carneal, MD, PhD. It was created on their behalf by Glee ArvinAmanda J. Manson PasseyBrown, OA an ophthalmic technician. The creation of this record is the provider's dictation and/or activities during the visit.    Electronically signed by: Glee ArvinAmanda J. Kristopher OppenheimBrown, OA 04.12.2024 12:53 PM  Karie ChimeraBrian G. Danicka Hourihan, M.D., Ph.D. Diseases & Surgery of the Retina and Vitreous [facility] 08/29/2022   I have reviewed the above documentation for accuracy and completeness, and I agree with the above. Karie ChimeraBrian G. Chelsey Kimberley, M.D., Ph.D. 08/29/22 12:55 PM  Abbreviations: M myopia (nearsighted); A astigmatism; H hyperopia (farsighted); P presbyopia; Mrx spectacle prescription;  CTL contact lenses; OD right eye; OS left eye; OU both eyes  XT exotropia; ET esotropia; PEK punctate epithelial keratitis; PEE punctate epithelial erosions; DES dry eye syndrome; MGD meibomian gland dysfunction; ATs artificial tears; PFAT's preservative free artificial tears; NSC nuclear sclerotic cataract; PSC posterior subcapsular cataract; ERM epi-retinal membrane; PVD posterior vitreous detachment; RD retinal detachment; DM diabetes mellitus; DR diabetic retinopathy; NPDR non-proliferative diabetic retinopathy; PDR proliferative diabetic retinopathy; CSME clinically  significant macular edema; DME diabetic macular edema; dbh dot blot hemorrhages; CWS cotton wool spot; POAG primary open angle glaucoma; C/D cup-to-disc ratio; HVF humphrey visual field; GVF goldmann visual field; OCT optical coherence tomography; IOP intraocular pressure; BRVO Branch retinal vein occlusion; CRVO central retinal vein occlusion; CRAO central retinal artery occlusion; BRAO branch retinal artery occlusion; RT retinal tear; SB scleral buckle; PPV pars plana vitrectomy; VH Vitreous hemorrhage; PRP panretinal laser photocoagulation; IVK intravitreal kenalog; VMT vitreomacular traction; MH Macular hole;  NVD neovascularization of the disc; NVE neovascularization elsewhere; AREDS age related eye disease study; ARMD age related macular degeneration; POAG primary open angle glaucoma; EBMD epithelial/anterior basement membrane dystrophy; ACIOL anterior chamber intraocular lens; IOL intraocular lens; PCIOL posterior chamber intraocular lens; Phaco/IOL phacoemulsification with intraocular lens placement; PRK photorefractive keratectomy; LASIK laser assisted in situ keratomileusis; HTN hypertension; DM diabetes mellitus; COPD chronic obstructive pulmonary disease

## 2022-08-29 ENCOUNTER — Encounter (INDEPENDENT_AMBULATORY_CARE_PROVIDER_SITE_OTHER): Payer: Self-pay | Admitting: Ophthalmology

## 2022-08-29 ENCOUNTER — Ambulatory Visit (INDEPENDENT_AMBULATORY_CARE_PROVIDER_SITE_OTHER): Payer: Medicare Other | Admitting: Ophthalmology

## 2022-08-29 DIAGNOSIS — H33322 Round hole, left eye: Secondary | ICD-10-CM

## 2022-08-29 DIAGNOSIS — H43813 Vitreous degeneration, bilateral: Secondary | ICD-10-CM

## 2022-08-29 DIAGNOSIS — H353131 Nonexudative age-related macular degeneration, bilateral, early dry stage: Secondary | ICD-10-CM | POA: Diagnosis not present

## 2022-08-29 DIAGNOSIS — H35371 Puckering of macula, right eye: Secondary | ICD-10-CM | POA: Diagnosis not present

## 2022-08-29 DIAGNOSIS — H25813 Combined forms of age-related cataract, bilateral: Secondary | ICD-10-CM

## 2023-02-26 NOTE — Progress Notes (Signed)
[facility] - Clinic Note  02/27/2023     CHIEF COMPLAINT Patient presents for Retina Follow Up   HISTORY OF PRESENT ILLNESS: John Jenkins is a 72 y.o. male who presents to the clinic today for:   HPI     Retina Follow Up   Patient presents with  Other.  In left eye.  This started years ago.  Severity is moderate.  Duration of 6 months.  Since onset it is gradually worsening.  I, the attending physician,  performed the HPI with the patient and updated documentation appropriately.        Comments   Patient states that the vision is the same, slightly blurry.  He is not using eye drops.       Last edited by Rennis Chris, MD on 02/28/2023  9:07 PM.     Patient states vision is doing well  Referring physician: [facility] [address] [place],  [place] [address]  HISTORICAL INFORMATION:   Selected notes from the MEDICAL RECORD NUMBER Referred by Dr. Charise Killian for concern of retinal hole LEE:  Ocular Hx- PMH-    CURRENT MEDICATIONS: No current outpatient medications on file. (Ophthalmic Drugs)   No current facility-administered medications for this visit. (Ophthalmic Drugs)   Current Outpatient Medications (Other)  Medication Sig   Calcium-Magnesium-Zinc (CAL-MAG-ZINC PO) Take 1 tablet by mouth daily.   Cinnamon 500 MG capsule Take 500 mg by mouth daily.   COLLAGEN-VITAMIN C PO Take 1 tablet by mouth daily.   Multiple Vitamin (MULTIVITAMIN WITH MINERALS) TABS tablet Take 1 tablet by mouth daily. Centrum Silver   Omega-3 Fatty Acids (FISH OIL PO) Take 1 capsule by mouth daily.    potassium gluconate 595 (99 K) MG TABS tablet Take 595 mg by mouth daily.   vitamin C (ASCORBIC ACID) 500 MG tablet Take 500 mg by mouth daily.   zinc gluconate 50 MG tablet Take 50 mg by mouth daily.   apixaban (ELIQUIS) 5 MG TABS tablet Take 5mg  daily   apixaban (ELIQUIS) 5 MG TABS tablet Take 1 tablet (5 mg total) by mouth 2 (two) times daily. (Patient  taking differently: Take 2.5 mg by mouth 2 (two) times daily. )   ELIQUIS 5 MG TABS tablet TAKE 1 TABLET BY MOUTH TWICE A DAY   fluconazole (DIFLUCAN) 200 MG tablet Take 4 tablets (800 mg total) by mouth daily. (Patient not taking: Reported on 07/18/2019)   fluticasone (FLONASE) 50 MCG/ACT nasal spray Place 2 sprays into the nose daily.   levocetirizine (XYZAL) 5 MG tablet Take 5 mg by mouth every evening.   loratadine (CLARITIN) 10 MG tablet Take 10 mg by mouth at bedtime.   metoprolol tartrate (LOPRESSOR) 50 MG tablet Take 1 tablet (50 mg total) by mouth 2 (two) times daily.   senna-docusate (SENOKOT-S) 8.6-50 MG tablet Take 1 tablet by mouth daily as needed for mild constipation.   traMADol (ULTRAM) 50 MG tablet Take 1-2 tablets (50-100 mg total) by mouth every 6 (six) hours. (Patient not taking: Reported on 10/12/2018)   vitamin B-12 (CYANOCOBALAMIN) 1000 MCG tablet Take 1,000 mcg by mouth daily.   No current facility-administered medications for this visit. (Other)   REVIEW OF SYSTEMS: ROS   Positive for: HENT, Cardiovascular, Eyes Negative for: Constitutional, Gastrointestinal, Neurological, Skin, Genitourinary, Musculoskeletal, Endocrine, Respiratory, Psychiatric, Allergic/Imm, Heme/Lymph Last edited by Charlette Caffey, COT on 02/27/2023  8:01 AM.      ALLERGIES No Known Allergies  PAST  MEDICAL HISTORY Past Medical History:  Diagnosis Date   DVT (deep venous thrombosis) (HCC) 2019   Obese    Pneumonia    Seasonal allergies    Past Surgical History:  Procedure Laterality Date   ANKLE SURGERY  1974   broken arm  1970   COLONOSCOPY WITH PROPOFOL N/A 07/18/2019   Procedure: COLONOSCOPY WITH PROPOFOL;  Surgeon: Toledo, Boykin Nearing, MD;  Location: [facility] ENDOSCOPY;  Service: Gastroenterology;  Laterality: N/A;   IVC FILTER REMOVAL N/A 07/20/2018   Procedure: IVC FILTER REMOVAL;  Surgeon: Renford Dills, MD;  Location: [facility] INVASIVE CV LAB;  Service: Cardiovascular;  Laterality:  N/A;   PERIPHERAL VASCULAR THROMBECTOMY Left 05/04/2018   Procedure: PERIPHERAL VASCULAR THROMBECTOMY;  Surgeon: Renford Dills, MD;  Location: [facility] INVASIVE CV LAB;  Service: Cardiovascular;  Laterality: Left;   VIDEO ASSISTED THORACOSCOPY (VATS)/THOROCOTOMY Left 06/30/2018   Procedure: VIDEO ASSISTED THORACOSCOPY (VATS)/THOROCOTOMY WITH LUNG BIOPSY-LEFT  PRE-OP BRONCH;  Surgeon: Hulda Marin, MD;  Location: [facility] ORS;  Service: General;  Laterality: Left;   FAMILY HISTORY Family History  Problem Relation Age of Onset   Heart disease Mother    Heart attack Father    SOCIAL HISTORY Social History   Tobacco Use   Smoking status: Never   Smokeless tobacco: Never  Vaping Use   Vaping status: Never Used  Substance Use Topics   Alcohol use: Never   Drug use: Never       OPHTHALMIC EXAM:  Base Eye Exam     Visual Acuity (Snellen - Linear)       Right Left   Dist cc 20/25 20/20   Dist ph cc NI          Tonometry (Tonopen, 8:05 AM)       Right Left   Pressure 14 15         Pupils       Dark Light Shape React APD   Right 3 2 Round Brisk None   Left 3 2 Round Brisk None         Visual Fields       Left Right    Full Full         Extraocular Movement       Right Left    Full, Ortho Full, Ortho         Neuro/Psych     Oriented x3: Yes   Mood/Affect: Normal         Dilation     Both eyes: 1.0% Mydriacyl, 2.5% Phenylephrine @ 8:01 AM           Slit Lamp and Fundus Exam     Slit Lamp Exam       Right Left   Lids/Lashes Dermatochalasis - upper lid Dermatochalasis - upper lid   Conjunctiva/Sclera temporal pinguecula temporal pinguecula   Cornea trace PEE trace PEE   Anterior Chamber deep and clear deep and clear   Iris Round and dilated Round and dilated   Lens 2+ Nuclear sclerosis, 2-3+ Cortical cataract 2+ Nuclear sclerosis, 2-3+ Cortical cataract   Anterior Vitreous mild syneresis, Posterior vitreous detachment mild syneresis,  Posterior vitreous detachment         Fundus Exam       Right Left   Disc Pink and Sharp, Compact, mild PPA Pink and Sharp, Compact, mild PPA   C/D Ratio 0.5 0.4   Macula Flat, Blunted foveal reflex, mild ERM with striae and central thickening -- stable from prior, no  heme Flat, Good foveal reflex, RPE mottling and central clumping, geographic area of hypopigmentation IN fovea and macula   Vessels attenuated, mild tortuosity attenuated, Tortuous   Periphery Attached, mild reticular degeneration, No heme Attached, operculated hole with surrounding pigment at 0800 -- now with good laser surronding, mild reticular degeneration, no new RT/RD or lattice, pigmented paving stone degeneration inferiorly           Refraction     Wearing Rx       Sphere Cylinder Axis Add   Right --4.00 +2.25 081 +2.50   Left -5.72 +3.25 091 +2.50           IMAGING AND PROCEDURES  Imaging and Procedures for 02/27/2023  OCT, Retina - OU - Both Eyes       Right Eye Quality was good. Central Foveal Thickness: 428. Progression has been stable. Findings include no SRF, abnormal foveal contour, retinal drusen , epiretinal membrane, intraretinal fluid, macular pucker (Focal ERM with pucker, focal retinal thickening and cystic changes nasal fovea and mac, rare fine drusen).   Left Eye Quality was good. Central Foveal Thickness: 283. Progression has been stable. Findings include normal foveal contour, no IRF, no SRF, retinal drusen , outer retinal atrophy (Focal decrease in ellipsoid signal IN fovea).   Notes *Images captured and stored on drive  Diagnosis / Impression:  OD: Focal ERM with pucker, retinal thickening and cystic changes nasal fovea and mac, rare fine drusen OS: Focal decrease in ellipsoid signal IN fovea -- stable  Clinical management:  See below  Abbreviations: NFP - Normal foveal profile. CME - cystoid macular edema. PED - pigment epithelial detachment. IRF - intraretinal fluid. SRF  - subretinal fluid. EZ - ellipsoid zone. ERM - epiretinal membrane. ORA - outer retinal atrophy. ORT - outer retinal tubulation. SRHM - subretinal hyper-reflective material. IRHM - intraretinal hyper-reflective material             ASSESSMENT/PLAN:    ICD-10-CM   1. Epiretinal membrane (ERM) of right eye  H35.371 OCT, Retina - OU - Both Eyes    2. Retinal hole of left eye  H33.322     3. Posterior vitreous detachment of both eyes  H43.813     4. Early dry stage nonexudative age-related macular degeneration of both eyes  H35.3131     5. Combined forms of age-related cataract of both eyes  H25.813       1 . Epiretinal membrane, right eye - stable - mild focal ERM -- nasal macula and fovea - BCVA stable at 20/25  - asymptomatic, no significant metamorphopsia -- just generalized blur - no indication for surgery at this time - monitor for now - f/u 9 months DFE, OCT  2. Retinal hole, OS   - operculated hole with surrounding pigment at 0800 - s/p laser retinopexy (12.22.23) -- good laser changes in place - no new RT/RD - monitor  3. Age related macular degeneration, non-exudative, both eyes - The incidence, anatomy, and pathology of dry AMD, risk of progression, and the AREDS and AREDS 2 study including smoking risks discussed with patient.  - Recommend amsler grid monitoring  - f/u 9 months DFE, OCT  4. PVD OU  - pt with mildly symptomatic floater OD  - Discussed findings and prognosis  - No RT or RD OU; operculated hole OS as abaove  - Reviewed s/s of RT/RD  - strict return precautions for any such RT/RD symptoms  5. Mixed Cataract OU - The symptoms  of cataract, surgical options, and treatments and risks were discussed with patient. - discussed diagnosis and progression - not yet visually significant  - monitor for now  Ophthalmic Meds Ordered this visit:  No orders of the defined types were placed in this encounter.    Return in about 9 months (around  11/27/2023) for f/u ERM OD, DFE, OCT.  There are no Patient Instructions on file for this visit.  This document serves as a record of services personally performed by Karie Chimera, MD, PhD. It was created on their behalf by Berlin Hun COT, an ophthalmic technician. The creation of this record is the provider's dictation and/or activities during the visit.    Electronically signed by: Berlin Hun COT 10.10.24 9:08 PM  This document serves as a record of services personally performed by Karie Chimera, MD, PhD. It was created on their behalf by Glee Arvin. Manson Passey, OA an ophthalmic technician. The creation of this record is the provider's dictation and/or activities during the visit.    Electronically signed by: Glee Arvin. Manson Passey, OA 02/28/23 9:08 PM  Karie Chimera, M.D., Ph.D. Diseases & Surgery of the Retina and Vitreous [facility] 02/27/2023   I have reviewed the above documentation for accuracy and completeness, and I agree with the above. Karie Chimera, M.D., Ph.D. 02/28/23 9:08 PM   Abbreviations: M myopia (nearsighted); A astigmatism; H hyperopia (farsighted); P presbyopia; Mrx spectacle prescription;  CTL contact lenses; OD right eye; OS left eye; OU both eyes  XT exotropia; ET esotropia; PEK punctate epithelial keratitis; PEE punctate epithelial erosions; DES dry eye syndrome; MGD meibomian gland dysfunction; ATs artificial tears; PFAT's preservative free artificial tears; NSC nuclear sclerotic cataract; PSC posterior subcapsular cataract; ERM epi-retinal membrane; PVD posterior vitreous detachment; RD retinal detachment; DM diabetes mellitus; DR diabetic retinopathy; NPDR non-proliferative diabetic retinopathy; PDR proliferative diabetic retinopathy; CSME clinically significant macular edema; DME diabetic macular edema; dbh dot blot hemorrhages; CWS cotton wool spot; POAG primary open angle glaucoma; C/D cup-to-disc ratio; HVF humphrey visual  field; GVF goldmann visual field; OCT optical coherence tomography; IOP intraocular pressure; BRVO Branch retinal vein occlusion; CRVO central retinal vein occlusion; CRAO central retinal artery occlusion; BRAO branch retinal artery occlusion; RT retinal tear; SB scleral buckle; PPV pars plana vitrectomy; VH Vitreous hemorrhage; PRP panretinal laser photocoagulation; IVK intravitreal kenalog; VMT vitreomacular traction; MH Macular hole;  NVD neovascularization of the disc; NVE neovascularization elsewhere; AREDS age related eye disease study; ARMD age related macular degeneration; POAG primary open angle glaucoma; EBMD epithelial/anterior basement membrane dystrophy; ACIOL anterior chamber intraocular lens; IOL intraocular lens; PCIOL posterior chamber intraocular lens; Phaco/IOL phacoemulsification with intraocular lens placement; PRK photorefractive keratectomy; LASIK laser assisted in situ keratomileusis; HTN hypertension; DM diabetes mellitus; COPD chronic obstructive pulmonary disease

## 2023-02-27 ENCOUNTER — Encounter (INDEPENDENT_AMBULATORY_CARE_PROVIDER_SITE_OTHER): Payer: Self-pay | Admitting: Ophthalmology

## 2023-02-27 ENCOUNTER — Ambulatory Visit (INDEPENDENT_AMBULATORY_CARE_PROVIDER_SITE_OTHER): Payer: Medicare Other | Admitting: Ophthalmology

## 2023-02-27 DIAGNOSIS — H25813 Combined forms of age-related cataract, bilateral: Secondary | ICD-10-CM

## 2023-02-27 DIAGNOSIS — H33322 Round hole, left eye: Secondary | ICD-10-CM | POA: Diagnosis not present

## 2023-02-27 DIAGNOSIS — H35371 Puckering of macula, right eye: Secondary | ICD-10-CM

## 2023-02-27 DIAGNOSIS — H43813 Vitreous degeneration, bilateral: Secondary | ICD-10-CM | POA: Diagnosis not present

## 2023-02-27 DIAGNOSIS — H353131 Nonexudative age-related macular degeneration, bilateral, early dry stage: Secondary | ICD-10-CM | POA: Diagnosis not present

## 2023-02-28 ENCOUNTER — Encounter (INDEPENDENT_AMBULATORY_CARE_PROVIDER_SITE_OTHER): Payer: Self-pay | Admitting: Ophthalmology

## 2023-11-26 NOTE — Progress Notes (Signed)
 [facility] - Clinic Note  11/27/2023     CHIEF COMPLAINT Patient presents for Retina Follow Up   HISTORY OF PRESENT ILLNESS: John Jenkins is a 73 y.o. male who presents to the clinic today for:   HPI     Retina Follow Up   Patient presents with  Other.  In right eye.  This started 9 months ago.  I, the attending physician,  performed the HPI with the patient and updated documentation appropriately.        Comments   Patient here for 9 months retina follow up for ERM OD. Patient states vision doing fine. OD still not correctable to blurry stuff. No eye pain. Added vitamine K1 and Black seed oil.      Last edited by Valdemar Rogue, MD on 11/29/2023  3:50 PM.    Patient states  Referring physician: [facility] [address] [place],  [place] [address]  HISTORICAL INFORMATION:   Selected notes from the MEDICAL RECORD NUMBER Referred by Dr. Darroll for concern of retinal hole LEE:  Ocular Hx- PMH-    CURRENT MEDICATIONS: No current outpatient medications on file. (Ophthalmic Drugs)   No current facility-administered medications for this visit. (Ophthalmic Drugs)   Current Outpatient Medications (Other)  Medication Sig   BLACK CURRANT SEED OIL PO Take by mouth.   Calcium-Magnesium-Zinc (CAL-MAG-ZINC PO) Take 1 tablet by mouth daily.   Cinnamon 500 MG capsule Take 500 mg by mouth daily.   COLLAGEN-VITAMIN C  PO Take 1 tablet by mouth daily.   Multiple Vitamin (MULTIVITAMIN WITH MINERALS) TABS tablet Take 1 tablet by mouth daily. Centrum Silver   Omega-3 Fatty Acids (FISH OIL PO) Take 1 capsule by mouth daily.    potassium gluconate 595 (99 K) MG TABS tablet Take 595 mg by mouth daily.   vitamin C  (ASCORBIC ACID ) 500 MG tablet Take 500 mg by mouth daily.   zinc gluconate 50 MG tablet Take 50 mg by mouth daily.   apixaban  (ELIQUIS ) 5 MG TABS tablet Take 5mg  daily   apixaban  (ELIQUIS ) 5 MG TABS tablet Take 1 tablet (5 mg total) by mouth 2  (two) times daily. (Patient taking differently: Take 2.5 mg by mouth 2 (two) times daily. )   ELIQUIS  5 MG TABS tablet TAKE 1 TABLET BY MOUTH TWICE A DAY   fluconazole  (DIFLUCAN ) 200 MG tablet Take 4 tablets (800 mg total) by mouth daily. (Patient not taking: Reported on 07/18/2019)   fluticasone  (FLONASE ) 50 MCG/ACT nasal spray Place 2 sprays into the nose daily.   levocetirizine (XYZAL) 5 MG tablet Take 5 mg by mouth every evening.   loratadine  (CLARITIN ) 10 MG tablet Take 10 mg by mouth at bedtime.   metoprolol  tartrate (LOPRESSOR ) 50 MG tablet Take 1 tablet (50 mg total) by mouth 2 (two) times daily.   senna-docusate (SENOKOT-S) 8.6-50 MG tablet Take 1 tablet by mouth daily as needed for mild constipation.   traMADol  (ULTRAM ) 50 MG tablet Take 1-2 tablets (50-100 mg total) by mouth every 6 (six) hours. (Patient not taking: Reported on 10/12/2018)   vitamin B-12 (CYANOCOBALAMIN) 1000 MCG tablet Take 1,000 mcg by mouth daily.   No current facility-administered medications for this visit. (Other)   REVIEW OF SYSTEMS: ROS   Positive for: HENT, Cardiovascular, Eyes Negative for: Constitutional, Gastrointestinal, Neurological, Skin, Genitourinary, Musculoskeletal, Endocrine, Respiratory, Psychiatric, Allergic/Imm, Heme/Lymph Last edited by Orval Asberry GORMAN, COA on 11/27/2023  7:52 AM.     ALLERGIES No  Known Allergies  PAST MEDICAL HISTORY Past Medical History:  Diagnosis Date   DVT (deep venous thrombosis) (HCC) 2019   Obese    Pneumonia    Seasonal allergies    Past Surgical History:  Procedure Laterality Date   ANKLE SURGERY  1974   broken arm  1970   COLONOSCOPY WITH PROPOFOL  N/A 07/18/2019   Procedure: COLONOSCOPY WITH PROPOFOL ;  Surgeon: Toledo, Ladell POUR, MD;  Location: [facility] ENDOSCOPY;  Service: Gastroenterology;  Laterality: N/A;   IVC FILTER REMOVAL N/A 07/20/2018   Procedure: IVC FILTER REMOVAL;  Surgeon: Jama Cordella MATSU, MD;  Location: [facility] INVASIVE CV LAB;  Service:  Cardiovascular;  Laterality: N/A;   PERIPHERAL VASCULAR THROMBECTOMY Left 05/04/2018   Procedure: PERIPHERAL VASCULAR THROMBECTOMY;  Surgeon: Jama Cordella MATSU, MD;  Location: [facility] INV[facility] CV LAB;  Service: Cardiovascular;  Laterality: Left;   VIDEO ASSISTED THORACOSCOPY (VATS)/THOROCOTOMY Left 06/30/2018   Procedure: VIDEO ASSISTED THORACOSCOPY (VATS)/THOROCOTOMY WITH LUNG BIOPSY-LEFT  PRE-OP BRONCH;  Surgeon: Volney Lye, MD;  Location: [facility] ORS;  Service: General;  Laterality: Left;   FAMILY HISTORY Family History  Problem Relation Age of Onset   Heart disease Mother    Heart attack Father    SOCIAL HISTORY Social History   Tobacco Use   Smoking status: Never   Smokeless tobacco: Never  Vaping Use   Vaping status: Never Used  Substance Use Topics   Alcohol use: Never   Drug use: Never       OPHTHALMIC EXAM:  Base Eye Exam     Visual Acuity (Snellen - Linear)       Right Left   Dist cc 20/25 20/20 -2   Dist ph cc NI     Correction: Glasses         Tonometry (Tonopen, 7:50 AM)       Right Left   Pressure 15 16         Pupils       Dark Light Shape React APD   Right 3 2 Round Brisk None   Left 3 2 Round Brisk None         Visual Fields (Counting fingers)       Left Right    Full Full         Extraocular Movement       Right Left    Full, Ortho Full, Ortho         Neuro/Psych     Oriented x3: Yes   Mood/Affect: Normal         Dilation     Both eyes: 1.0% Mydriacyl, 2.5% Phenylephrine  @ 7:50 AM           Slit Lamp and Fundus Exam     Slit Lamp Exam       Right Left   Lids/Lashes Dermatochalasis - upper lid Dermatochalasis - upper lid   Conjunctiva/Sclera temporal pinguecula temporal pinguecula   Cornea trace PEE, tear film debris trace PEE, trace tear film debris   Anterior Chamber deep and clear deep and clear   Iris Round and dilated Round and dilated   Lens 2+ Nuclear sclerosis, 2-3+ Cortical cataract 2+ Nuclear  sclerosis, 2-3+ Cortical cataract   Anterior Vitreous mild syneresis, Posterior vitreous detachment mild syneresis, Posterior vitreous detachment         Fundus Exam       Right Left   Disc Pink and Sharp, Compact, mild PPA Pink and Sharp, Compact, mild PPA   C/D Ratio 0.5 0.4  Macula Flat, Blunted foveal reflex, mild ERM with striae and central thickening, no heme Flat, Good foveal reflex, RPE mottling and central clumping, geographic area of hypopigmentation IN fovea and macula   Vessels attenuated, Tortuous attenuated, Tortuous   Periphery Attached, mild reticular degeneration, No heme Attached, operculated hole with surrounding pigment at 0800 -- now with good laser surronding, mild reticular degeneration, no new RT/RD or lattice, pigmented paving stone degeneration inferiorly           Refraction     Wearing Rx       Sphere Cylinder Axis Add   Right --4.00 +2.25 081 +2.50   Left -5.72 +3.25 091 +2.50           IMAGING AND PROCEDURES  Imaging and Procedures for 11/27/2023  OCT, Retina - OU - Both Eyes       Right Eye Quality was good. Central Foveal Thickness: 447. Progression has worsened. Findings include no SRF, abnormal foveal contour, retinal drusen , epiretinal membrane, intraretinal fluid, macular pucker (Mild progression of ERM with blunting of foveal contour and pucker, persistent focal retinal thickening and cystic changes nasal mac, rare fine drusen).   Left Eye Quality was good. Central Foveal Thickness: 283. Progression has been stable. Findings include normal foveal contour, no IRF, no SRF, retinal drusen , outer retinal atrophy (Focal decrease in ellipsoid signal IN fovea).   Notes *Images captured and stored on drive  Diagnosis / Impression:  OD: Mild progression of ERM with blunting of foveal contour and pucker, persistent focal retinal thickening and cystic changes nasal mac, rare fine drusen OS: Focal decrease in ellipsoid signal IN fovea --  stable  Clinical management:  See below  Abbreviations: NFP - Normal foveal profile. CME - cystoid macular edema. PED - pigment epithelial detachment. IRF - intraretinal fluid. SRF - subretinal fluid. EZ - ellipsoid zone. ERM - epiretinal membrane. ORA - outer retinal atrophy. ORT - outer retinal tubulation. SRHM - subretinal hyper-reflective material. IRHM - intraretinal hyper-reflective material            ASSESSMENT/PLAN:    ICD-10-CM   1. Epiretinal membrane (ERM) of right eye  H35.371 OCT, Retina - OU - Both Eyes    2. Retinal hole of left eye  H33.322     3. Posterior vitreous detachment of both eyes  H43.813     4. Early dry stage nonexudative age-related macular degeneration of both eyes  H35.3131     5. Combined forms of age-related cataract of both eyes  H25.813      1 . Epiretinal membrane, right eye - mild progression - BCVA stable at 20/25  - asymptomatic, no significant metamorphopsia -- just generalized blur - OCT shows OD: Mild progression of ERM with blunting of foveal contour and pucker, persistent focal retinal thickening and cystic changes nasal mac, rare fine drusen at 9 mos - no indication for surgery at this time, but will monitor more closely - f/u 6 months - DFE, OCT  2. Retinal hole, OS   - operculated hole with surrounding pigment at 0800 - s/p laser retinopexy (12.22.23) -- good laser changes in place - no new RT/RD - monitor  3. Age related macular degeneration, non-exudative, both eyes - The incidence, anatomy, and pathology of dry AMD, risk of progression, and the AREDS and AREDS 2 study including smoking risks discussed with patient.  - Recommend amsler grid monitoring  - monitor  4. PVD OU  - pt with mildly symptomatic floater OD  -  Discussed findings and prognosis  - No RT or RD OU; operculated hole OS as abaove  - Reviewed s/s of RT/RD  - strict return precautions for any such RT/RD symptoms  5. Mixed Cataract OU - The symptoms  of cataract, surgical options, and treatments and risks were discussed with patient. - discussed diagnosis and progression - not yet visually significant  - monitor for now  Ophthalmic Meds Ordered this visit:  No orders of the defined types were placed in this encounter.    Return in about 6 months (around 05/29/2024) for f/u ERM OD, DFE, OCT.  There are no Patient Instructions on file for this visit.  This document serves as a record of services personally performed by Redell JUDITHANN Hans, MD, PhD. It was created on their behalf by Delon Newness COT, an ophthalmic technician. The creation of this record is the provider's dictation and/or activities during the visit.    Electronically signed by: Delon Newness COT 07.10.25 3:51 PM  This document serves as a record of services personally performed by Redell JUDITHANN Hans, MD, PhD. It was created on their behalf by Alan PARAS. Delores, OA an ophthalmic technician. The creation of this record is the provider's dictation and/or activities during the visit.    Electronically signed by: Alan PARAS. Delores, OA 11/29/23 3:51 PM  Redell JUDITHANN Hans, M.D., Ph.D. Diseases & Surgery of the Retina and Vitreous [facility] 11/27/2023   I have reviewed the above documentation for accuracy and completeness, and I agree with the above. Redell JUDITHANN Hans, M.D., Ph.D. 11/29/23 3:54 PM    Abbreviations: M myopia (nearsighted); A astigmatism; H hyperopia (farsighted); P presbyopia; Mrx spectacle prescription;  CTL contact lenses; OD right eye; OS left eye; OU both eyes  XT exotropia; ET esotropia; PEK punctate epithelial keratitis; PEE punctate epithelial erosions; DES dry eye syndrome; MGD meibomian gland dysfunction; ATs artificial tears; PFAT's preservative free artificial tears; NSC nuclear sclerotic cataract; PSC posterior subcapsular cataract; ERM epi-retinal membrane; PVD posterior vitreous detachment; RD retinal detachment; DM diabetes  mellitus; DR diabetic retinopathy; NPDR non-proliferative diabetic retinopathy; PDR proliferative diabetic retinopathy; CSME clinically significant macular edema; DME diabetic macular edema; dbh dot blot hemorrhages; CWS cotton wool spot; POAG primary open angle glaucoma; C/D cup-to-disc ratio; HVF humphrey visual field; GVF goldmann visual field; OCT optical coherence tomography; IOP intraocular pressure; BRVO Branch retinal vein occlusion; CRVO central retinal vein occlusion; CRAO central retinal artery occlusion; BRAO branch retinal artery occlusion; RT retinal tear; SB scleral buckle; PPV pars plana vitrectomy; VH Vitreous hemorrhage; PRP panretinal laser photocoagulation; IVK intravitreal kenalog; VMT vitreomacular traction; MH Macular hole;  NVD neovascularization of the disc; NVE neovascularization elsewhere; AREDS age related eye disease study; ARMD age related macular degeneration; POAG primary open angle glaucoma; EBMD epithelial/anterior basement membrane dystrophy; ACIOL anterior chamber intraocular lens; IOL intraocular lens; PCIOL posterior chamber intraocular lens; Phaco/IOL phacoemulsification with intraocular lens placement; PRK photorefractive keratectomy; LASIK laser assisted in situ keratomileusis; HTN hypertension; DM diabetes mellitus; COPD chronic obstructive pulmonary disease

## 2023-11-27 ENCOUNTER — Ambulatory Visit (INDEPENDENT_AMBULATORY_CARE_PROVIDER_SITE_OTHER): Payer: Medicare Other | Admitting: Ophthalmology

## 2023-11-27 ENCOUNTER — Encounter (INDEPENDENT_AMBULATORY_CARE_PROVIDER_SITE_OTHER): Payer: Self-pay | Admitting: Ophthalmology

## 2023-11-27 DIAGNOSIS — H33322 Round hole, left eye: Secondary | ICD-10-CM

## 2023-11-27 DIAGNOSIS — H25813 Combined forms of age-related cataract, bilateral: Secondary | ICD-10-CM

## 2023-11-27 DIAGNOSIS — H43813 Vitreous degeneration, bilateral: Secondary | ICD-10-CM

## 2023-11-27 DIAGNOSIS — H35371 Puckering of macula, right eye: Secondary | ICD-10-CM | POA: Diagnosis not present

## 2023-11-27 DIAGNOSIS — H353131 Nonexudative age-related macular degeneration, bilateral, early dry stage: Secondary | ICD-10-CM

## 2023-11-29 ENCOUNTER — Encounter (INDEPENDENT_AMBULATORY_CARE_PROVIDER_SITE_OTHER): Payer: Self-pay | Admitting: Ophthalmology

## 2024-05-20 NOTE — Progress Notes (Signed)
 " [facility] - Clinic Note  05/27/2024     CHIEF COMPLAINT Patient presents for Retina Follow Up   HISTORY OF PRESENT ILLNESS: John Jenkins is a 74 y.o. male who presents to the clinic today for:   HPI     Retina Follow Up   Patient presents with  Other.  In right eye.  Severity is moderate.  Duration of 6 months.  Since onset it is stable.  I, the attending physician,  performed the HPI with the patient and updated documentation appropriately.        Comments   Pt here for 6 mo ret f/u for ERM OD. Pt states VA is stable, unchanged. No medical hx changes, no gtts reported.       Last edited by Valdemar Rogue, MD on 06/05/2024  3:19 PM.     Patient states the distortion is about the same definitely not worse.     Referring physician: Darroll Anes, DO [address] [place],  [place] [id number]  HISTORICAL INFORMATION:   Selected notes from the MEDICAL RECORD NUMBER Referred by Dr. Darroll for concern of retinal hole LEE:  Ocular Hx- PMH-    CURRENT MEDICATIONS: No current outpatient medications on file. (Ophthalmic Drugs)   No current facility-administered medications for this visit. (Ophthalmic Drugs)   Current Outpatient Medications (Other)  Medication Sig   BLACK CURRANT SEED OIL PO Take by mouth.   Calcium-Magnesium-Zinc (CAL-MAG-ZINC PO) Take 1 tablet by mouth daily.   Cinnamon 500 MG capsule Take 500 mg by mouth daily.   COLLAGEN-VITAMIN C  PO Take 1 tablet by mouth daily.   Multiple Vitamin (MULTIVITAMIN WITH MINERALS) TABS tablet Take 1 tablet by mouth daily. Centrum Silver   Omega-3 Fatty Acids (FISH OIL PO) Take 1 capsule by mouth daily.    potassium gluconate 595 (99 K) MG TABS tablet Take 595 mg by mouth daily.   vitamin C  (ASCORBIC ACID ) 500 MG tablet Take 500 mg by mouth daily.   zinc gluconate 50 MG tablet Take 50 mg by mouth daily.   apixaban  (ELIQUIS ) 5 MG TABS tablet Take 5mg  daily   apixaban  (ELIQUIS ) 5 MG TABS tablet Take 1  tablet (5 mg total) by mouth 2 (two) times daily. (Patient taking differently: Take 2.5 mg by mouth 2 (two) times daily. )   ELIQUIS  5 MG TABS tablet TAKE 1 TABLET BY MOUTH TWICE A DAY   fluconazole  (DIFLUCAN ) 200 MG tablet Take 4 tablets (800 mg total) by mouth daily. (Patient not taking: Reported on 07/18/2019)   fluticasone  (FLONASE ) 50 MCG/ACT nasal spray Place 2 sprays into the nose daily.   levocetirizine (XYZAL) 5 MG tablet Take 5 mg by mouth every evening.   loratadine  (CLARITIN ) 10 MG tablet Take 10 mg by mouth at bedtime.   metoprolol  tartrate (LOPRESSOR ) 50 MG tablet Take 1 tablet (50 mg total) by mouth 2 (two) times daily.   senna-docusate (SENOKOT-S) 8.6-50 MG tablet Take 1 tablet by mouth daily as needed for mild constipation.   traMADol  (ULTRAM ) 50 MG tablet Take 1-2 tablets (50-100 mg total) by mouth every 6 (six) hours. (Patient not taking: Reported on 10/12/2018)   vitamin B-12 (CYANOCOBALAMIN) 1000 MCG tablet Take 1,000 mcg by mouth daily.   No current facility-administered medications for this visit. (Other)   REVIEW OF SYSTEMS: ROS   Positive for: HENT, Cardiovascular, Eyes Negative for: Constitutional, Gastrointestinal, Neurological, Skin, Genitourinary, Musculoskeletal, Endocrine, Respiratory, Psychiatric, Allergic/Imm, Heme/Lymph Last edited by Antonetta Lee  E, COT on 05/27/2024  7:55 AM.      ALLERGIES No Known Allergies  PAST MEDICAL HISTORY Past Medical History:  Diagnosis Date   DVT (deep venous thrombosis) (HCC) 2019   Obese    Pneumonia    Seasonal allergies    Past Surgical History:  Procedure Laterality Date   ANKLE SURGERY  1974   broken arm  1970   COLONOSCOPY WITH PROPOFOL  N/A 07/18/2019   Procedure: COLONOSCOPY WITH PROPOFOL ;  Surgeon: Toledo, Ladell POUR, MD;  Location: [facility] ENDOSCOPY;  Service: Gastroenterology;  Laterality: N/A;   IVC FILTER REMOVAL N/A 07/20/2018   Procedure: IVC FILTER REMOVAL;  Surgeon: Jama Cordella MATSU, MD;  Location: [facility]  INVASIVE CV LAB;  Service: Cardiovascular;  Laterality: N/A;   PERIPHERAL VASCULAR THROMBECTOMY Left 05/04/2018   Procedure: PERIPHERAL VASCULAR THROMBECTOMY;  Surgeon: Jama Cordella MATSU, MD;  Location: [facility] INVASIVE CV LAB;  Service: Cardiovascular;  Laterality: Left;   VIDEO ASSISTED THORACOSCOPY (VATS)/THOROCOTOMY Left 06/30/2018   Procedure: VIDEO ASSISTED THORACOSCOPY (VATS)/THOROCOTOMY WITH LUNG BIOPSY-LEFT  PRE-OP BRONCH;  Surgeon: Volney Lye, MD;  Location: [facility] ORS;  Service: General;  Laterality: Left;   FAMILY HISTORY Family History  Problem Relation Age of Onset   Heart disease Mother    Heart attack Father    SOCIAL HISTORY Social History   Tobacco Use   Smoking status: Never   Smokeless tobacco: Never  Vaping Use   Vaping status: Never Used  Substance Use Topics   Alcohol use: Never   Drug use: Never       OPHTHALMIC EXAM:  Base Eye Exam     Visual Acuity (Snellen - Linear)       Right Left   Dist cc 20/30 +2 20/20 -2   Dist ph cc NI     Correction: Glasses         Tonometry (Tonopen, 8:04 AM)       Right Left   Pressure 13 13         Pupils       Pupils Dark Light Shape React APD   Right PERRL 3 2 Round Brisk None   Left PERRL 3 2 Round Brisk None         Visual Fields (Counting fingers)       Left Right    Full Full         Extraocular Movement       Right Left    Full, Ortho Full, Ortho         Neuro/Psych     Oriented x3: Yes   Mood/Affect: Normal         Dilation     Both eyes: 1.0% Mydriacyl, 2.5% Phenylephrine  @ 8:05 AM           Slit Lamp and Fundus Exam     Slit Lamp Exam       Right Left   Lids/Lashes Dermatochalasis - upper lid Dermatochalasis - upper lid   Conjunctiva/Sclera temporal pinguecula temporal pinguecula   Cornea trace PEE, tear film debris trace PEE, trace tear film debris   Anterior Chamber deep and clear deep and clear   Iris Round and dilated Round and dilated   Lens 2+  Nuclear sclerosis, 2-3+ Cortical cataract 2+ Nuclear sclerosis, 2-3+ Cortical cataract   Anterior Vitreous mild syneresis, Posterior vitreous detachment mild syneresis, Posterior vitreous detachment         Fundus Exam       Right Left   Disc  Pink and Sharp, Compact, mild PPA Pink and Sharp, Compact, mild PPA   C/D Ratio 0.5 0.4   Macula Flat, Blunted foveal reflex, mild ERM with striae and central thickening, no heme Flat, Good foveal reflex, RPE mottling and central clumping, geographic area of hypopigmentation IN fovea and macula   Vessels attenuated, Tortuous attenuated, Tortuous   Periphery Attached, mild reticular degeneration, No heme Attached, operculated hole with surrounding pigment at 0800 -- now with good laser surronding, mild reticular degeneration, no new RT/RD or lattice, pigmented paving stone degeneration inferiorly           Refraction     Wearing Rx       Sphere Cylinder Axis Add   Right --4.00 +2.25 081 +2.50   Left -5.72 +3.25 091 +2.50  Same rx specs        Manifest Refraction       Sphere Cylinder Axis Dist VA   Right -4.00 +2.25 080 20/NI   Left               IMAGING AND PROCEDURES  Imaging and Procedures for 05/27/2024  OCT, Retina - OU - Both Eyes       Right Eye Quality was good. Central Foveal Thickness: 469. Progression has been stable. Findings include no SRF, abnormal foveal contour, retinal drusen , epiretinal membrane, intraretinal fluid, macular pucker (ERM with blunting of foveal contour and pucker--stable, persistent focal retinal thickening and cystic changes nasal mac, rare fine drusen).   Left Eye Quality was good. Central Foveal Thickness: 278. Progression has been stable. Findings include normal foveal contour, no IRF, no SRF, retinal drusen , outer retinal atrophy (Focal decrease in ellipsoid signa/drusenl IN fovea).   Notes *Images captured and stored on drive  Diagnosis / Impression:  OD: ERM with blunting of foveal  contour and pucker--stable, persistent focal retinal thickening and cystic changes nasal mac, rare fine drusen OS: Focal decrease in ellipsoid signal/drusen IN fovea -- stable  Clinical management:  See below  Abbreviations: NFP - Normal foveal profile. CME - cystoid macular edema. PED - pigment epithelial detachment. IRF - intraretinal fluid. SRF - subretinal fluid. EZ - ellipsoid zone. ERM - epiretinal membrane. ORA - outer retinal atrophy. ORT - outer retinal tubulation. SRHM - subretinal hyper-reflective material. IRHM - intraretinal hyper-reflective material             ASSESSMENT/PLAN:    ICD-10-CM   1. Epiretinal membrane (ERM) of right eye  H35.371 OCT, Retina - OU - Both Eyes    2. Retinal hole of left eye  H33.322     3. Posterior vitreous detachment of both eyes  H43.813     4. Early dry stage nonexudative age-related macular degeneration of both eyes  H35.3131     5. Combined forms of age-related cataract of both eyes  H25.813      1 . Epiretinal membrane, right eye - mild progression - BCVA 20/30 from 20/25  - asymptomatic, no significant metamorphopsia -- just generalized blur - OCT shows OD: ERM with blunting of foveal contour and pucker--stable, persistent focal retinal thickening and cystic changes nasal mac, rare fine drusen at 6 mos - no indication for surgery at this time, but will monitor more closely - f/u 6 months - DFE, OCT  2. Retinal hole, OS   - operculated hole with surrounding pigment at 0800 - s/p laser retinopexy (12.22.23) -- good laser changes in place - no new RT/RD - monitor  3. Age related  macular degeneration, non-exudative, both eyes - The incidence, anatomy, and pathology of dry AMD, risk of progression, and the AREDS and AREDS 2 study including smoking risks discussed with patient.  - Recommend amsler grid monitoring  - monitor  4. PVD OU  - pt with mildly symptomatic floater OD  - Discussed findings and prognosis  - No RT or  RD OU; operculated hole OS as abaove  - Reviewed s/s of RT/RD  - strict return precautions for any such RT/RD symptoms  5. Mixed Cataract OU - The symptoms of cataract, surgical options, and treatments and risks were discussed with patient. - discussed diagnosis and progression - not yet visually significant  - monitor for now  Ophthalmic Meds Ordered this visit:  No orders of the defined types were placed in this encounter.    Return in about 6 months (around 11/24/2024) for f/u, ERM, DFE, OCT.  There are no Patient Instructions on file for this visit.  This document serves as a record of services personally performed by Redell JUDITHANN Hans, MD, PhD. It was created on their behalf by Delon Newness COT, an ophthalmic technician. The creation of this record is the provider's dictation and/or activities during the visit.    Electronically signed by: Delon Newness COT 01.02.26 3:21 PM  This document serves as a record of services personally performed by Redell JUDITHANN Hans, MD, PhD. It was created on their behalf by Wanda GEANNIE Keens, COT an ophthalmic technician. The creation of this record is the provider's dictation and/or activities during the visit.    Electronically signed by:  Wanda GEANNIE Keens, COT  06/05/24 3:21 PM  Redell JUDITHANN Hans, M.D., Ph.D. Diseases & Surgery of the Retina and Vitreous [facility] 05/27/2024   I have reviewed the above documentation for accuracy and completeness, and I agree with the above. Redell JUDITHANN Hans, M.D., Ph.D. 06/05/24 3:23 PM   Abbreviations: M myopia (nearsighted); A astigmatism; H hyperopia (farsighted); P presbyopia; Mrx spectacle prescription;  CTL contact lenses; OD right eye; OS left eye; OU both eyes  XT exotropia; ET esotropia; PEK punctate epithelial keratitis; PEE punctate epithelial erosions; DES dry eye syndrome; MGD meibomian gland dysfunction; ATs artificial tears; PFAT's preservative free artificial  tears; NSC nuclear sclerotic cataract; PSC posterior subcapsular cataract; ERM epi-retinal membrane; PVD posterior vitreous detachment; RD retinal detachment; DM diabetes mellitus; DR diabetic retinopathy; NPDR non-proliferative diabetic retinopathy; PDR proliferative diabetic retinopathy; CSME clinically significant macular edema; DME diabetic macular edema; dbh dot blot hemorrhages; CWS cotton wool spot; POAG primary open angle glaucoma; C/D cup-to-disc ratio; HVF humphrey visual field; GVF goldmann visual field; OCT optical coherence tomography; IOP intraocular pressure; BRVO Branch retinal vein occlusion; CRVO central retinal vein occlusion; CRAO central retinal artery occlusion; BRAO branch retinal artery occlusion; RT retinal tear; SB scleral buckle; PPV pars plana vitrectomy; VH Vitreous hemorrhage; PRP panretinal laser photocoagulation; IVK intravitreal kenalog; VMT vitreomacular traction; MH Macular hole;  NVD neovascularization of the disc; NVE neovascularization elsewhere; AREDS age related eye disease study; ARMD age related macular degeneration; POAG primary open angle glaucoma; EBMD epithelial/anterior basement membrane dystrophy; ACIOL anterior chamber intraocular lens; IOL intraocular lens; PCIOL posterior chamber intraocular lens; Phaco/IOL phacoemulsification with intraocular lens placement; PRK photorefractive keratectomy; LASIK laser assisted in situ keratomileusis; HTN hypertension; DM diabetes mellitus; COPD chronic obstructive pulmonary disease "

## 2024-05-27 ENCOUNTER — Ambulatory Visit (INDEPENDENT_AMBULATORY_CARE_PROVIDER_SITE_OTHER): Admitting: Ophthalmology

## 2024-05-27 ENCOUNTER — Encounter (INDEPENDENT_AMBULATORY_CARE_PROVIDER_SITE_OTHER): Payer: Self-pay | Admitting: Ophthalmology

## 2024-05-27 DIAGNOSIS — H25813 Combined forms of age-related cataract, bilateral: Secondary | ICD-10-CM | POA: Diagnosis not present

## 2024-05-27 DIAGNOSIS — H35371 Puckering of macula, right eye: Secondary | ICD-10-CM

## 2024-05-27 DIAGNOSIS — H353131 Nonexudative age-related macular degeneration, bilateral, early dry stage: Secondary | ICD-10-CM | POA: Diagnosis not present

## 2024-05-27 DIAGNOSIS — H43813 Vitreous degeneration, bilateral: Secondary | ICD-10-CM | POA: Diagnosis not present

## 2024-05-27 DIAGNOSIS — H33322 Round hole, left eye: Secondary | ICD-10-CM | POA: Diagnosis not present

## 2024-06-05 ENCOUNTER — Encounter (INDEPENDENT_AMBULATORY_CARE_PROVIDER_SITE_OTHER): Payer: Self-pay | Admitting: Ophthalmology

## 2024-11-25 ENCOUNTER — Encounter (INDEPENDENT_AMBULATORY_CARE_PROVIDER_SITE_OTHER): Admitting: Ophthalmology
